# Patient Record
Sex: Male | Born: 1985 | Race: White | Marital: Single | Smoking: Current every day smoker
Health system: Northeastern US, Community
[De-identification: ages and names within clinical notes are randomized; demographics above are authoritative.]

## PROBLEM LIST (undated history)

## (undated) DIAGNOSIS — F410 Panic disorder [episodic paroxysmal anxiety] without agoraphobia: Secondary | ICD-10-CM

## (undated) DIAGNOSIS — F419 Anxiety disorder, unspecified: Secondary | ICD-10-CM

## (undated) HISTORY — PX: NO SIGNIFICANT SURGICAL HISTORY: 1000005

---

## 2004-05-20 ENCOUNTER — Emergency Department: Payer: Self-pay | Admitting: Neurology

## 2005-05-17 ENCOUNTER — Ambulatory Visit (HOSPITAL_BASED_OUTPATIENT_CLINIC_OR_DEPARTMENT_OTHER): Payer: Charity | Admitting: Clinical

## 2005-05-17 DIAGNOSIS — F1210 Cannabis abuse, uncomplicated: Secondary | ICD-10-CM

## 2005-05-17 DIAGNOSIS — F331 Major depressive disorder, recurrent, moderate: Secondary | ICD-10-CM

## 2005-05-17 LAB — URINE DRUG SCREEN 7 DRUGS
AMPHETAMINES URINE: NEGATIVE
BARBITURATES URINE: NEGATIVE
BENZODIAZEPINES URINE: POSITIVE — AB
CANNABINOIDS URINE: POSITIVE — AB
COCAINE METABOLITES URINE: NEGATIVE
ETHANOL URINE: POSITIVE — AB
OPIATES URINE: NEGATIVE

## 2005-05-20 ENCOUNTER — Ambulatory Visit (HOSPITAL_BASED_OUTPATIENT_CLINIC_OR_DEPARTMENT_OTHER): Payer: Charity

## 2005-05-22 ENCOUNTER — Ambulatory Visit (HOSPITAL_BASED_OUTPATIENT_CLINIC_OR_DEPARTMENT_OTHER): Payer: Charity

## 2005-05-24 ENCOUNTER — Ambulatory Visit (HOSPITAL_BASED_OUTPATIENT_CLINIC_OR_DEPARTMENT_OTHER): Payer: Charity

## 2005-05-27 ENCOUNTER — Ambulatory Visit (HOSPITAL_BASED_OUTPATIENT_CLINIC_OR_DEPARTMENT_OTHER): Payer: Charity

## 2005-05-29 ENCOUNTER — Ambulatory Visit (HOSPITAL_BASED_OUTPATIENT_CLINIC_OR_DEPARTMENT_OTHER): Payer: Charity

## 2005-05-31 ENCOUNTER — Ambulatory Visit (HOSPITAL_BASED_OUTPATIENT_CLINIC_OR_DEPARTMENT_OTHER): Payer: Charity

## 2005-08-20 ENCOUNTER — Encounter (HOSPITAL_BASED_OUTPATIENT_CLINIC_OR_DEPARTMENT_OTHER): Payer: Self-pay | Admitting: Clinical

## 2005-08-20 NOTE — Progress Notes (Signed)
PSYCHIATRY TERMINATION AND TRANSFER NOTE    {TERMINATION    Date treatment started: 05-13-05    Transfer/termination date: 08-11-05    Reason for treatment: ongoing sobriety    Treatment course ( response to medications, compliance): Pt. Did not engage in treatment beyond the initial intake. Thus, he is officially terminated.    Outstanding Issues: alcohol dependence and marijuana dependence.    Safe to refill:     Risk level: 1    Plan: Continue to seek treatment for ongoing substance issues.    The treatment plan will now be the responsibility ZO:[id number] substance treater.  (Clinician, please effect this transfer on the Treatment Plan link.Jeannett Senior Konya Fauble-LICSW

## 2007-09-28 ENCOUNTER — Ambulatory Visit (HOSPITAL_BASED_OUTPATIENT_CLINIC_OR_DEPARTMENT_OTHER): Payer: Self-pay | Admitting: Internal Medicine

## 2007-09-29 ENCOUNTER — Encounter (HOSPITAL_BASED_OUTPATIENT_CLINIC_OR_DEPARTMENT_OTHER): Payer: Self-pay | Admitting: Internal Medicine

## 2007-11-17 ENCOUNTER — Encounter (HOSPITAL_BASED_OUTPATIENT_CLINIC_OR_DEPARTMENT_OTHER): Payer: Self-pay | Admitting: Internal Medicine

## 2009-05-30 ENCOUNTER — Encounter (HOSPITAL_BASED_OUTPATIENT_CLINIC_OR_DEPARTMENT_OTHER): Payer: Self-pay

## 2009-05-30 ENCOUNTER — Emergency Department (HOSPITAL_BASED_OUTPATIENT_CLINIC_OR_DEPARTMENT_OTHER)
Admission: RE | Admit: 2009-05-30 | Disposition: A | Discharge status: Home / Self Care | Payer: Self-pay | Source: Emergency Department | Attending: Emergency Medicine | Admitting: Emergency Medicine

## 2009-05-30 NOTE — ED Notes (Signed)
Pt. Held onto a railing on bleachers which was unknowingly broken resulting in a 5 inch laceration to his 5th finger.

## 2009-05-31 ENCOUNTER — Emergency Department (HOSPITAL_BASED_OUTPATIENT_CLINIC_OR_DEPARTMENT_OTHER)
Admission: RE | Admit: 2009-05-31 | Disposition: A | Discharge status: Home / Self Care | Payer: Self-pay | Source: Emergency Department

## 2009-05-31 ENCOUNTER — Encounter (HOSPITAL_BASED_OUTPATIENT_CLINIC_OR_DEPARTMENT_OTHER): Payer: Self-pay

## 2009-05-31 MED ORDER — IBUPROFEN 800 MG PO TABS
ORAL_TABLET | ORAL | Status: DC
Start: 2009-05-31 — End: 2013-08-13

## 2009-05-31 NOTE — ED Notes (Signed)
WAS SEEN LAST NIGHT FOR SUTURES TO BE PLACED IN LEFT HAND.  SUTURES "CAME OUT" THIS AM.  RETURNED FOR WOUND CHECK AND RESUTURE.

## 2009-06-05 LAB — EMERGENCY ROOM NOTE

## 2009-06-07 LAB — EMERGENCY ROOM NOTE

## 2009-10-11 ENCOUNTER — Ambulatory Visit (HOSPITAL_BASED_OUTPATIENT_CLINIC_OR_DEPARTMENT_OTHER): Payer: No Typology Code available for payment source

## 2009-10-11 DIAGNOSIS — M25579 Pain in unspecified ankle and joints of unspecified foot: Principal | ICD-10-CM

## 2009-10-11 NOTE — Patient Instructions (Signed)
Rehabilitation Treatment Flowsheet    Precautions: FWB    Date: 10/11/09         Initials: JM         Visit #: 1         Time:          HEP yes         Treatment(s)          PT EVAL x         McConnell tape to pull achilles tendon laterally X with decrease in pain         bike          Stretches  gastroc,  soleus            4 way ankle stretch/PROM             4 way ankle TB            TFM R achilles tendon    tib-fib mobs            SLS on airex

## 2009-10-11 NOTE — Progress Notes (Signed)
[facility]  OUTPATIENT EVALUATION    REFERRING PROVIDER: Verna Czech, MD  [address]  [place], [place] [address]   Hx OF PRESENT ILLNESS: 23 year old male sustained ankle fx while roller skating in aug'10.  NWB with 1/2 cast and B cx x 1 month, then casted for 6 weeks.  Cast removed, still used crutches for another 2 weeks and then progressed WB.  Doing HEP: standing DF.   PRECAUTIONS: FWB   MEDICATIONS (Rx Comments, concerns): For a list of current medications review the Medication activity.    LEARNS BEST: demonstration   MENTAL STATUS/COMMUNICATION: WNL     PRIMARY LANGUAGE: English     REQUIRES INTERPRETER: No   INTERPRETER PRESENT DURING EVAL: No   DRESSING/GROOMING: WNL     DRIVING: IMPAIRED: hasn't driven for 5 years     SLEEPING: IMPAIRED: wakes often d/t pain     POSTURE/ALIGNMENT: flattened arch R foot     OTHER: decreased tib-fib mobility     NEUROLOGICAL: IMPAIRED: pt reports occasional feeling of numbness 4th and 5th R toes and lateral border R foot     PALPATION: TTP at sinus tarsi, ATFL, Achilles     GAIT: antalgic with minimal heel strike on right and decreased RLE WB.  Climbs stairs in s-t-s pattern     SKIN INTEGRITY: DRY & INTACT       Please Note: Only populated fields were assessed by provider, fields left blank were not assessed.    GIRTH LEFT RIGHT GIRTH LEFT RIGHT GIRTH LEFT RIGHT   inframalleolar 28.5cm 30cm         Figure [phone] [id number]           OTHER:    USE IMAGES ACTIVITY. IMAGE AVAILABLE: No      LEFT RIGHT  LEFT RIGHT   SHOULD A/PROM MMT A/PROM MMT HIP A/PROM MMT A/PROM MMT   FLEX.     FLEX.       EXT.     EXT.       IR     IR       ER     ER       ABD     ABD       H. ABD     ADD       H. ADD     KNEE       ELBOW     FLEX       FLEX.     EXT       EXT.     ANKLE       WRIST     DF WNL 5 -5/5 4+   FLEX.     PF  5 60/70 1   EXT.     IV  5 30/42 4   SUP/  PRON     EV  5 20 4-      SPECIAL TEST LEFT RIGHT SPECIAL TEST LEFT RIGHT SPECIAL TEST LEFT RIGHT   Talar tilt  -         Anterior drawer  +          SLS  > 30 seconds 10 sec with marked sway           Physical Therapy Plan of Care    EP:PIRJJOA Alesia Morin, MD  Referring Provider: Dr. Burnadette Peter  Diagnosis: 719.47D Ankle pain  (primary encounter diagnosis)    Assessment/Objective Findings: Patient is a 23 year old male who reports to  PT after ankle fx in August '10.  Per pt, he fractured B malleoli and the proximal fibula.  Pt currently presents with pain in his right Ankle.  Upon evaluation, pt demonstrates above noted decrease in strength, ROM, balance and mobility.  He c/o numbness in lateral foot with walking > 15 minutes.  He does have a (+) anterior drawer test with possibility of ligamentous tearing. PT will focus and strengthening and stabilizing the R ankle.          Pain, Decreased ROM, Decreased Strength, Decreased Functional Mobility, Decreased Joint Mobility and Decreased Tolerance of ADLs  Patient will benefit from skilled PT to address the rehabilitation goals outlined below.    Rehabilitation Goals  Pt. will demonstrate increased ankle ROM by 50% throughout.   Duration: 8 weeks  Pt. will demonstrate increase right ankle strength by 1/2 grade throughout.   Duration: 8 weeks  Pt. will demonstrate ability to ambulate with pain free, normalized gait pattern.   Duration: 8 weeks  Pt completes strengthening program pain free.   Duration: 8 weeks  Average ankle pain will be reduced from a 8/10 to a 4/10. Duration: 8 weeks  Able to climb stairs in step over step pattern with railing. Duration: 8 weeks  Patient to be Independent with Home Exercise Program.  Duration: 2 weeks  Long Term Goal: Pt wishes to reduce or eliminate pain.      Treatment Plan: ** Stretching/ROM Exercise  ** Therapeutic Exercise  ** Home Exercise Program  ** Joint Mobilization  ** Soft Tissue Mobilization  ** Ultrasound  ** Hot/Cold Rx  ** Gait Training  ** Functional Activities  ** Patient Education  taping    Recommend Physical Therapy be continued 2 times per week for 8 weeks.  The  rehabilitation potential for this patient is good    Patient is agreeable to treatment plan and is aware of attendance policy.    Noralee Chars, PT

## 2009-10-13 ENCOUNTER — Ambulatory Visit (HOSPITAL_BASED_OUTPATIENT_CLINIC_OR_DEPARTMENT_OTHER): Payer: No Typology Code available for payment source | Admitting: Rehabilitative and Restorative Service Providers"

## 2009-10-13 NOTE — Patient Instructions (Signed)
Rehabilitation Treatment Flowsheet    Precautions: FWB    Date: 10/11/09         Initials: JM         Visit #: 1         Time:          HEP yes         Treatment(s)          PT EVAL x         McConnell tape to pull achilles tendon laterally X with decrease in pain         bike          Stretches  gastroc,  soleus            4 way ankle stretch/PROM             4 way ankle TB            TFM R achilles tendon    tib-fib mobs            SLS on airex

## 2009-10-18 ENCOUNTER — Ambulatory Visit (HOSPITAL_BASED_OUTPATIENT_CLINIC_OR_DEPARTMENT_OTHER): Payer: No Typology Code available for payment source | Admitting: Rehabilitative and Restorative Service Providers"

## 2009-10-18 DIAGNOSIS — M25579 Pain in unspecified ankle and joints of unspecified foot: Principal | ICD-10-CM

## 2009-10-18 NOTE — Patient Instructions (Addendum)
Rehabilitation Treatment Flowsheet    Precautions: FWB  12 visits 12/03 to 12/08/09  Date: 10/11/09 10/18/09        Initials: JM JR        Visit #: 1 2        Time:  30        HEP yes yes        Treatment(s)          PT EVAL x         McConnell tape to pull achilles tendon laterally X with decrease in pain         bike          Stretches  gastroc,  soleus    [place] pulsed @ 1.3 w/cm2 achilles tendon & medial to tendon        4 way ankle stretch/PROM    4 way ankle stretch/  PROM         4 way ankle TB x10 each    X with red theraband DF, PF, INV & EV        TFM R achilles tendon    tib-fib mobs    X in prone with STM      X in supine        SLS on airex

## 2009-10-18 NOTE — Progress Notes (Signed)
S:  Patient reports he has increased pain with rotating foot in and out (INV and EV).  States he has pain over achilles when he walks on the ball of his foot (during push-off.)  Patient reports he has pain on the inside (medial to achilles)  O: Received red theraband for ankle exercises.  Refer to Rehabilitation Flow Sheet for details.  A:  Noted with resistive INV,  he has weakness with return movement. Noted ankle weakness with manual resistive exercises. Gait:  Ambulated ascending stairs without assistive device with reciprocal sequencing.   Tolerated treatment well, except as otherwise noted.   P:  Continue with skilled PT services.

## 2009-10-20 ENCOUNTER — Ambulatory Visit (HOSPITAL_BASED_OUTPATIENT_CLINIC_OR_DEPARTMENT_OTHER): Payer: No Typology Code available for payment source | Admitting: Rehabilitative and Restorative Service Providers"

## 2009-10-20 DIAGNOSIS — M25579 Pain in unspecified ankle and joints of unspecified foot: Principal | ICD-10-CM

## 2009-10-20 NOTE — Progress Notes (Signed)
S:  Patient reports he had less pain and felt stronger post last treatment.  States he is able to walk now for 30 mins, instead of only 15 mins.  States he does his exercises every day.   O: Refer to Rehabilitation Flow Sheet for details.  A:  Patient has + compliance to resistive ankle exercises as demonstrated by his performance of exercises. Progressing with treatments increased ambulatory distance with good tolerance and independent with instructed exercises.  Tolerated treatment well.  P:  Continue with skilled PT services.

## 2009-10-20 NOTE — Patient Instructions (Addendum)
Rehabilitation Treatment Flowsheet    Precautions: FWB  12 visits 12/03 to 12/08/09  Date: 10/11/09 10/18/09 10/20/09       Initials: Edmonia Caprio JR JR       Visit #: 1 2 3        Time:  30 40       HEP yes yes yes       Treatment(s)          PT EVAL x         McConnell tape to pull achilles tendon laterally X with decrease in pain  McConnelltape to pull achilles tendon laterally       bike          Stretches  gastroc,  soleus    [place] pulsed @ 1.3 w/cm2 achilles tendon & medial to tendon [place] pulsed @ 1.3 w/cm2 achilles tendon & medial to tendon       4 way ankle stretch/PROM    4 way ankle stretch/  PROM         4 way ankle TB x10 each    X with red theraband DF, PF, INV & EV X with red theraband DF, PF, INV & EV       TFM R achilles tendon    tib-fib mobs    X in prone with STM      X in supine X in prone with STM                                           SLS on airex

## 2009-10-23 ENCOUNTER — Ambulatory Visit (HOSPITAL_BASED_OUTPATIENT_CLINIC_OR_DEPARTMENT_OTHER): Payer: No Typology Code available for payment source | Admitting: Rehabilitative and Restorative Service Providers"

## 2009-10-25 ENCOUNTER — Ambulatory Visit (HOSPITAL_BASED_OUTPATIENT_CLINIC_OR_DEPARTMENT_OTHER): Payer: No Typology Code available for payment source

## 2009-10-25 DIAGNOSIS — M25579 Pain in unspecified ankle and joints of unspecified foot: Principal | ICD-10-CM

## 2009-10-25 NOTE — Progress Notes (Addendum)
.    S: "The ankle feels a lot stronger.  I don't have any pain right now."  O: Refer to Rehabilitation Treatment Flowsheet  A: Pt able to perform all exercise with good tolerance.  Still presents with decreased balance/proprioception.  AROM much improved.  DF to 3 degrees (was -5 AROM)  P: Continue physical therapy per POC.

## 2009-10-25 NOTE — Patient Instructions (Addendum)
Rehabilitation Treatment Flowsheet    Precautions: FWB  12 visits 12/03 to 12/08/09  Date: 10/11/09 10/18/09 10/20/09 10/25/09      Initials: Edmonia Caprio JR JR JM      Visit #: 1 2 3 4       Time:  30 40       HEP yes yes yes       Treatment(s)          PT EVAL x         McConnell tape to pull achilles tendon laterally X with decrease in pain  McConnelltape to pull achilles tendon laterally       bike    X 5 min 1.5 Kp      Stretches  gastroc,  soleus    [place] pulsed @ 1.3 w/cm2 achilles tendon & medial to tendon [place] pulsed @ 1.3 w/cm2 achilles tendon & medial to tendon Stretch gastroc and soleus 30 sec x 3 each      4 way ankle stretch/PROM    4 way ankle stretch/  PROM  Rocker board SLS A/P and M/L 20x       4 way ankle TB x10 each    X with red theraband DF, PF, INV & EV X with red theraband DF, PF, INV & EV X GTB 10x      TFM R achilles tendon    tib-fib mobs  Subtalar rocks    X in prone with STM      X in supine X in prone with STM       Bosu squat forward and lateral 20x      X    x      BAPS    L2 A/P, M/L, CW, CCW x 20 each      SLS on airex    x      Tandem with TBand cross pull    10x each direction        Ice      X 10 min ankle

## 2009-10-30 ENCOUNTER — Ambulatory Visit (HOSPITAL_BASED_OUTPATIENT_CLINIC_OR_DEPARTMENT_OTHER): Payer: No Typology Code available for payment source

## 2009-10-30 DIAGNOSIS — M25579 Pain in unspecified ankle and joints of unspecified foot: Principal | ICD-10-CM

## 2009-10-30 NOTE — Progress Notes (Addendum)
.    S: Reports soreness after last rx until next am, then ok!  O: Refer to Rehabilitation Treatment Flowsheet  A: AROM is near WNL.  Pt wishes to get back to running and basketball.  Will begin to upgrade exercise as appropriate.  P: Continue physical therapy per POC.  Trial treadmill and light jogging next rx

## 2009-10-30 NOTE — Patient Instructions (Addendum)
Rehabilitation Treatment Flowsheet    Precautions: FWB  12 visits 12/03 to 12/08/09  Date: 10/11/09 10/18/09 10/20/09 10/25/09 10/30/09     Initials: JM JR JR JM JM     Visit #: 1 2 3 4 5      Time:  30 40       HEP yes yes yes       Treatment(s)          PT EVAL x         McConnell tape to pull achilles tendon laterally X with decrease in pain  McConnelltape to pull achilles tendon laterally       bike    X 5 min 1.5 Kp X 5 min     Stretches  gastroc,  soleus    [place] pulsed @ 1.3 w/cm2 achilles tendon & medial to tendon [place] pulsed @ 1.3 w/cm2 achilles tendon & medial to tendon Stretch gastroc and soleus 30 sec x 3 each gastroc and soleus 30 sec x 2 each     4 way ankle stretch/PROM    4 way ankle stretch/  PROM  Rocker board SLS A/P and M/L 20x Mini jumps on trampoline B and U LE      4 way ankle TB x10 each    X with red theraband DF, PF, INV & EV X with red theraband DF, PF, INV & EV X GTB 10x With 1 1/2 # weight 10x2     TFM R achilles tendon    tib-fib mobs  Subtalar rocks    X in prone with STM      X in supine X in prone with STM       Bosu squat forward and lateral 20x      X    x Bosu squat forward and lateral 20x      X  X       BAPS    L2 A/P, M/L, CW, CCW x 20 each L 3 20x each     SLS on airex    x x     Tandem with TBand cross pull    10x each direction With RTB 20 x each       Ice      X 10 min ankle x                Star pattern step downs 2" and 4" step x 10

## 2009-11-01 ENCOUNTER — Ambulatory Visit (HOSPITAL_BASED_OUTPATIENT_CLINIC_OR_DEPARTMENT_OTHER): Payer: No Typology Code available for payment source | Admitting: Rehabilitative and Restorative Service Providers"

## 2009-11-06 ENCOUNTER — Ambulatory Visit (HOSPITAL_BASED_OUTPATIENT_CLINIC_OR_DEPARTMENT_OTHER): Payer: No Typology Code available for payment source | Admitting: Rehabilitative and Restorative Service Providers"

## 2009-11-08 ENCOUNTER — Ambulatory Visit (HOSPITAL_BASED_OUTPATIENT_CLINIC_OR_DEPARTMENT_OTHER): Payer: No Typology Code available for payment source | Admitting: Rehabilitative and Restorative Service Providers"

## 2009-11-13 ENCOUNTER — Ambulatory Visit (HOSPITAL_BASED_OUTPATIENT_CLINIC_OR_DEPARTMENT_OTHER): Payer: No Typology Code available for payment source

## 2010-12-28 NOTE — Progress Notes (Signed)
This encounter was opened in error.  Please disregard.

## 2011-01-14 ENCOUNTER — Emergency Department (HOSPITAL_BASED_OUTPATIENT_CLINIC_OR_DEPARTMENT_OTHER)
Admission: RE | Admit: 2011-01-14 | Disposition: A | Discharge status: Home / Self Care | Payer: Self-pay | Source: Emergency Department | Attending: Emergency Medicine | Admitting: Emergency Medicine

## 2011-01-14 ENCOUNTER — Encounter (HOSPITAL_BASED_OUTPATIENT_CLINIC_OR_DEPARTMENT_OTHER): Payer: Self-pay

## 2011-01-14 LAB — HOLD RED TOP TUBE

## 2011-01-14 LAB — COMPREHENSIVE METABOLIC PANEL
ALANINE AMINOTRANSFERASE: 29 IU/L (ref 10–40)
ALBUMIN: 4.8 g/dl (ref 3.4–4.8)
ALKALINE PHOSPHATASE: 83 IU/L (ref 25–106)
ANION GAP: 14 mmol/L (ref 2–25)
ASPARTATE AMINOTRANSFERASE: 38 IU/L — ABNORMAL HIGH (ref 8–34)
BILIRUBIN TOTAL: 0.8 mg/dl (ref 0.2–1.1)
BUN (UREA NITROGEN): 9 mg/dl (ref 6–20)
CALCIUM: 10.2 mg/dl (ref 8.6–10.3)
CARBON DIOXIDE: 24 mmol/L (ref 22–32)
CHLORIDE: 102 mmol/L (ref 101–111)
CREATININE: 1.1 mg/dl (ref 0.7–1.2)
ESTIMATED GLOMERULAR FILT RATE: >60 mL/min (ref >60)
Glucose Random: 114 mg/dl (ref 74–160)
POTASSIUM: 3.9 mmol/L (ref 3.5–5.1)
SODIUM: 140 mmol/L (ref 135–144)
TOTAL PROTEIN: 7.6 g/dl — ABNORMAL HIGH (ref 5.9–7.5)

## 2011-01-14 LAB — CBC, PLATELET & DIFFERENTIAL
BASOPHIL %: 0.1 % (ref 0.0–2.0)
EOSINOPHIL %: 2.2 % (ref 0.0–7.0)
HEMATOCRIT: 47.7 % (ref 42.0–54.0)
HEMOGLOBIN: 16.5 g/dl (ref 14.0–18.0)
LYMPHOCYTE %: 13.9 % (ref 13.0–39.0)
MEAN CORP HGB CONC: 34.7 g/dl (ref 32.0–36.0)
MEAN CORPUSCULAR HGB: 32.4 pg (ref 27.0–33.0)
MEAN CORPUSCULAR VOL: 93.5 fl (ref 80.0–100.0)
MEAN PLATELET VOLUME: 8.3 fl (ref 6.4–10.8)
MONOCYTE %: 8.3 % (ref 1.0–12.0)
NEUTROPHIL %: 75.5 % (ref 46.0–79.0)
PLATELET COUNT: 290 10*3/uL (ref 150–400)
RBC DISTRIBUTION WIDTH: 13.3 % (ref 11.5–14.3)
RED BLOOD CELL COUNT: 5.1 M/uL (ref 4.50–6.10)
WHITE BLOOD CELL COUNT: 11.2 10*3/uL — ABNORMAL HIGH (ref 4.0–10.8)

## 2011-01-14 LAB — XR CHEST 2 VIEWS

## 2011-01-14 LAB — LIPASE: LIPASE: 20 U/L (ref 10–50)

## 2011-01-14 MED ORDER — LORAZEPAM 1 MG PO TABS
1.0000 mg | ORAL_TABLET | Freq: Three times a day (TID) | ORAL | Status: AC | PRN
Start: 2011-01-14 — End: 2011-02-13

## 2011-01-14 MED ORDER — SODIUM CHLORIDE 0.9 % IV BOLUS
1000.00 mL | Freq: Once | INTRAVENOUS | Status: AC
Start: 2011-01-14 — End: 2011-01-14
  Administered 2011-01-14: 1000 mL via INTRAVENOUS

## 2011-01-14 MED ORDER — LORAZEPAM 1 MG PO TABS
1.00 mg | ORAL_TABLET | Freq: Once | ORAL | Status: AC
Start: 2011-01-14 — End: 2011-01-14
  Administered 2011-01-14: 1 mg via ORAL
  Filled 2011-01-14: qty 1

## 2011-01-14 NOTE — Discharge Instructions (Signed)
You were seen in the ER for difficulty breathing and hyperventilation.  Lab tests and an xray were normal, as was an ekg. It is most likely  You had a panic attack.      Rest.   Drink plenty of fluids and eat well.  Take ativan as needed for anxiety. This is only a short term solution.  Do not drink alcohol or drive while taking this medicine.      Follow up with your primary care doctor.  If you need a new doctor, please call the [facility] for an appointment,.      Please see Peter Roberson's office (financial counselor) on your way out to get help with the insurance issues.        Return to the ER for worsening symptoms, fever ,vomiting, fainting, chest pain, shortness of breath, or any other concerning symptoms

## 2011-01-15 LAB — EKG

## 2011-01-17 LAB — EMERGENCY ROOM NOTE

## 2011-04-12 ENCOUNTER — Emergency Department (HOSPITAL_BASED_OUTPATIENT_CLINIC_OR_DEPARTMENT_OTHER)
Admission: RE | Admit: 2011-04-12 | Disposition: A | Discharge status: Home / Self Care | Payer: Self-pay | Source: Emergency Department | Attending: Physician Assistant | Admitting: Physician Assistant

## 2011-04-12 ENCOUNTER — Encounter (HOSPITAL_BASED_OUTPATIENT_CLINIC_OR_DEPARTMENT_OTHER): Payer: Self-pay

## 2011-04-12 HISTORY — DX: Panic disorder (episodic paroxysmal anxiety): F41.0

## 2011-04-12 HISTORY — DX: Anxiety disorder, unspecified: F41.9

## 2011-04-12 LAB — RAPID STREP (POINT OF CARE): STREP SCREEN: POSITIVE

## 2011-04-12 MED ORDER — DIPHENHYDRAMINE HCL 25 MG PO CAPS
25.00 mg | ORAL_CAPSULE | Freq: Once | ORAL | Status: AC
Start: 2011-04-12 — End: 2011-04-12
  Administered 2011-04-12: 25 mg via ORAL
  Filled 2011-04-12: qty 1

## 2011-04-12 MED ORDER — PREDNISONE 20 MG PO TABS
60.00 mg | ORAL_TABLET | Freq: Once | ORAL | Status: AC
Start: 2011-04-12 — End: 2011-04-12
  Administered 2011-04-12: 60 mg via ORAL
  Filled 2011-04-12: qty 3

## 2011-04-12 MED ORDER — PREDNISONE 20 MG PO TABS
60.00 mg | ORAL_TABLET | Freq: Every day | ORAL | Status: AC
Start: 2011-04-13 — End: 2011-04-15

## 2011-04-12 MED ORDER — PENICILLIN V POTASSIUM 500 MG PO TABS
500.00 mg | ORAL_TABLET | Freq: Once | ORAL | Status: AC
Start: 2011-04-12 — End: 2011-04-12
  Administered 2011-04-12: 500 mg via ORAL
  Filled 2011-04-12: qty 1

## 2011-04-12 MED ORDER — PENICILLIN V POTASSIUM 500 MG PO TABS
500.00 mg | ORAL_TABLET | Freq: Three times a day (TID) | ORAL | Status: AC
Start: 2011-04-12 — End: 2011-04-22

## 2011-04-12 NOTE — ED Notes (Signed)
Seen by PA. Medicated per order.

## 2011-04-12 NOTE — ED Notes (Signed)
Patient Disposition    Patient education for diagnosis, medications, activity, diet and follow-up.  Patient left ED 2:13 PM.  Patient rep received written instructions.    Discharged to: home

## 2011-04-12 NOTE — ED Triage Note (Signed)
C/o sore throat pain with swallowing.  "My uvula is bleeding."  Throat red with exudate.

## 2011-04-12 NOTE — ED Provider Notes (Signed)
This patient was seen primarily by me. I have reviewed the nursing notes and medication list.    HPI:  This is a 25 year old year old male presenting to the ER with a chief complaint of concern about uvular swelling. States this morning was clearing his throat, felt uvula touching his tongue and then had lots of blood in his mouth. Concerned it may have ripped. Denies sore throat, fever or sick contacts. Pt is allergic to tree nuts but has had none this morning, usual reaction to that is hives. No rash today, no tingling in lips or difficulty swallowing or breathing. Did not take any new medications this morning. Patient is a smoker, takes only Paxil daily.       ROS: Pertinent positives were reviewed as per the HPI above. All other systems were reviewed and are negative.    Past Medical History:    Past Medical History    NO KNOWN PROBLEMS     Anxiety     Panic attack            Past Surgical History:    Past Surgical History    NO SIGNIFICANT SURGICAL HISTORY          Medications:     No current facility-administered medications on file prior to encounter.  Current outpatient prescriptions ordered prior to encounter:  paroxetine (PAXIL) 10 MG tablet Take 10 mg by mouth every morning. Disp:  Rfl:    IBUPROFEN 800 MG OR TABS Take 1 tab by mouth every 8 hours as needed for pain Disp: 20 Rfl: 0         Social History:    Social History   Marital Status: Single  Spouse Name: N/A    Years of Education: N/A  Number of Children: N/A     Occupational History  None on file     Social History Main Topics   Smoking status: Current Everyday Smoker  0.5 Packs/Day  For 10.00 Years     Types: Cigarettes    Smokeless tobacco:     Alcohol Use: Yes    Comment: occas.    Drug Use: Yes     Special: Marijuana    Sexually Active: Yes  Partner(s): Male    Birth Control/ Protection: Condom     Other Topics Concern   None on file     Social History Narrative   None on file       Allergies:  Review of Patient's Allergies indicates:  No  Known Allergies    Physical Exam:  BP 129/92  Pulse 88  Temp 98.5 F  Resp 16  Ht 1.829 m (6')  Wt 90.719 kg (200 lb)  BMI 27.12 kg/m2  SpO2 100%    General:  Alert x oriented x 4, no acute distress    HEENT:  NCAT, PERRL, EOMI, pharynx is erythematous, tonsils bilaterally enlarged but no exudate. Uvula is noted to be swollen, small ulcerative type sore on left side.  Tympanic membranes are normal appearing bilaterally. Neck is supple.    Lymph: No palpable lymphadenopathy    Heart:  RRR, no murmurs, rubs or gallops    Lungs:  Clear to auscultation in all fields    Musculoskeletal:  No obvious deformity, no ROM defecit, distal CSM intact, 5/5 strength throughout    Skin:  Warm, dry, no rash    Psych:  Normal affect      ED Course and Medical Decision-making:    Rapid strep +  Unlikely allergic reaction due to isolated uvular edema with rapid strep  Benadryl 25 mg / Prednisone 60 mg / Penicillin VK 500 mg in  ED        Reasons to return to the ED were reviewed in detail. The patient agrees with this plan and disposition.Discharged in stable condition.      Diagnoses:  Strep throat      Plan:  Benadryl 25 mg q 6h  Prednisone 60 mg PO x 2 more days  Penicillin VK 500 mg x 10 days  F/u PCP  Return to ED PRN    Marlene Lard, PA-C

## 2011-04-12 NOTE — Discharge Instructions (Signed)
You have strep throat. Drink plenty of fluids and take your antibiotic until it is gone. Alternate Tylenol with Motrin per box dosing instructions for fever and pain control. You can use over the counter throat lozenges to help as well. Follow up with your PCP in 2 days for follow up and return to ED with difficulty swallowing or breathing, worsening pain or uncontrolled fever.  Take Prednisone as prescribed for 2 more days starting tomorrow. Benadryl 25 mg every 6 hours will also help swelling.

## 2012-01-02 ENCOUNTER — Encounter (HOSPITAL_BASED_OUTPATIENT_CLINIC_OR_DEPARTMENT_OTHER): Payer: Self-pay | Admitting: Registered Nurse

## 2012-01-02 ENCOUNTER — Emergency Department (HOSPITAL_BASED_OUTPATIENT_CLINIC_OR_DEPARTMENT_OTHER)
Admission: RE | Admit: 2012-01-02 | Disposition: A | Discharge status: Home / Self Care | Payer: Self-pay | Source: Emergency Department | Attending: Physician Assistant | Admitting: Physician Assistant

## 2012-01-02 LAB — XR HAND RIGHT MINIMUM 3 VIEWS

## 2012-01-02 NOTE — ED Notes (Signed)
Pt to xray as ordered

## 2012-01-02 NOTE — ED Notes (Signed)
Patient Disposition  Splint and sling on as ordered.  Patient education for diagnosis, medications, activity, diet and follow-up.  Patient left ED 12:55 PM.  Patient received written instructions.  Pt ambulatory out of ED.     Discharged to: home

## 2012-01-02 NOTE — ED Triage Note (Signed)
Pt states he punched a wall yesterday injuring rt hand  Pt was seen at [facility] last night and had xray's  Ace wrap applied and motrin given  Pt called today and told that his x-rays were positive for fracture  Pt states he was told to go to ED for further treatment  Pt rt hand is swollen in area of 5 th metacarpal  Decrease ROM to digits  Alert, oriented and ambulatory into ED

## 2012-01-02 NOTE — ED Provider Notes (Signed)
eMERGENCY dEPARTMENT eNCOUnter    CHIEF COMPLAINT    Patient presents with:    Hand Pain - HAND PAIN      HPI    Peter Roberson is a 26 year old male who presents with right hand injury.  Patient states that he punched a wall yesterday injuring his right hand.  He states that he has started been to [facility], and x-ray was taken that he states showed a fracture.  01/02/2012 11:15 AM.    PAST MEDICAL HISTORY      Past Medical History    NO KNOWN PROBLEMS     Anxiety     Panic attack          SURGICAL HISTORY      Past Surgical History    NO SIGNIFICANT SURGICAL HISTORY          CURRENT MEDICATIONS    1. PAROXETINE HCL 10 MG PO TABS  Route: Oral ZOX:WRUE 10 mg by mouth every morning.  Dispense:  Refill:   2. IBUPROFEN 800 MG PO TABS  Route: Oral AVW:UJWJ 1 tab by mouth every 8 hours as needed for pain  Dispense: 20 Refill: 0    ALLERGIES    Review of Patient's Allergies indicates:  No Known Allergies    FAMILY HISTORY    History reviewed. No pertinent family history.    SOCIAL HISTORY    Social History    Marital Status: Single              Spouse Name:                       Years of Education:                 Number of children:               Social History Main Topics    Smoking Status: Current Everyday Smoker         Packs/Day: .5    Years: 10        Types: Cigarettes    Alcohol Use: Yes                Comment: occas.    Drug Use: Yes                Special: Marijuana    Sexual Activity: Yes               Partners with: Male       Birth Control/Protection: Condom        REVIEW OF SYSTEMS    Constitutional:  Denies fever, chills, weight loss or weakness.  Respiratory:  Denies cough or shortness of breath.  Cardiovascular:  Denies chest pain, palpitations or swelling.  Musculoskeletal:  Denies back pain.  Psychiatric:  Denies depression, suicidal ideation or homicidal ideation.  All systems negative except as marked.     PHYSICAL EXAM    Vital Signs: BP 156/77  Pulse 104  Temp 98.6 F  Resp 20  SpO2  99%   Constitutional:  Well-developed, Well-nourished, No acute distress, Non-toxic appearance.   Respiratory:  Normal breath sounds, No respiratory distress, No wheezing, No chest tenderness.   Cardiovascular:  Normal heart rate, Normal rhythm, No murmurs, No rubs, No gallops.   Musculoskeletal:  Intact distal pulses, minor edema and tenderness through the right fifth metacarpal.  Full active range of motion of all fingers on right hand.  Skin:  Warm,  Dry, skin shows no abrasions or contusions to the right hand.    Psychiatric:  Affect normal, Judgment normal, Mood normal.       RADIOLOGY  Plain film right hand    ED COURSE & MEDICAL DECISION MAKING    Patient states that he broke his hand yesterday- an x-ray was done to show an old fracture of the proximal fifth phalange.  No other fractures found by radiology.  Patient was given an arm sling and a wrist splint; he states he has primary care physician to followup with.  FINAL IMPRESSION  Hand injury  CONSULTS  None    FOLLOW-UP  Follow-up Information    None            Electronically signed by: Nicole Kindred. Shalynn Jorstad, PA-C, 01/02/2012 12:21 PM

## 2012-01-02 NOTE — ED Notes (Signed)
Pt returned to ED, awaiting results

## 2012-01-02 NOTE — Discharge Instructions (Signed)
Take Tylenol or Motrin 2 to 3 tablets every 4 to 6 hours as needed for pain. Keep elevated and apply ice pack to affected area to control swelling. Read discharge instructions given and follow up with your doctor as needed.    Hand Injuries  You have an injured hand.  Minor fractures, sprains, bruises and burns of the hand are often managed in much the same way. Treatment includes:  · Keep your hand elevated above the level of your heart for the next few days until the pain and swelling improve.   · Hand dressings and splints are used to reduce motion, relieve pain and prevent re-injury. Do not remove your dressing and splint until your doctor approves.   · Apply ice packs for 20-30 minutes every few hours for 2-3 days to reduce pain and swelling due to fractures, sprains, and deep bruises.   · Medicine to reduce pain and inflammation is often helpful.   Early motion exercises are sometimes needed to reduce joint stiffness after a hand injury; however, do not use your hand for any activities that increase pain.   Please see your doctor for follow-up care as advised.  Document Released: 12/05/2004 Document Re-Released: 01/22/2010  [employer]® Patient Information ©2012 [employer].

## 2013-08-13 ENCOUNTER — Emergency Department (HOSPITAL_BASED_OUTPATIENT_CLINIC_OR_DEPARTMENT_OTHER)
Admission: RE | Admit: 2013-08-13 | Disposition: A | Discharge status: Home / Self Care | Payer: Self-pay | Source: Emergency Department | Attending: Physician Assistant | Admitting: Physician Assistant

## 2013-08-13 ENCOUNTER — Encounter (HOSPITAL_BASED_OUTPATIENT_CLINIC_OR_DEPARTMENT_OTHER): Payer: Self-pay

## 2013-08-13 LAB — CBC, PLATELET & DIFFERENTIAL
ABSOLUTE BASO COUNT: 0 10*3/uL (ref 0.0–0.1)
ABSOLUTE EOSINOPHIL COUNT: 0.1 10*3/uL (ref 0.0–0.8)
ABSOLUTE IMM GRAN COUNT: 0.09 10*3/uL — ABNORMAL HIGH (ref 0.00–0.03)
ABSOLUTE LYMPH COUNT: 1.7 10*3/uL (ref 0.6–5.9)
ABSOLUTE MONO COUNT: 1.5 10*3/uL — ABNORMAL HIGH (ref 0.2–1.4)
ABSOLUTE NEUTROPHIL COUNT: 8.5 10*3/uL — ABNORMAL HIGH (ref 1.6–8.3)
BASOPHIL %: 0.3 % (ref 0.0–1.2)
EOSINOPHIL %: 1.2 % (ref 0.0–7.0)
HEMATOCRIT: 48.7 % (ref 40.1–51.0)
HEMOGLOBIN: 16.5 g/dL (ref 13.7–17.5)
IMMATURE GRANULOCYTE %: 0.8 % — ABNORMAL HIGH (ref 0.0–0.4)
LYMPHOCYTE %: 14 % — ABNORMAL LOW (ref 15.0–54.0)
MEAN CORP HGB CONC: 33.9 g/dL (ref 31.0–37.0)
MEAN CORPUSCULAR HGB: 31.3 pg (ref 26.0–34.0)
MEAN CORPUSCULAR VOL: 92.4 fL (ref 80.0–100.0)
MEAN PLATELET VOLUME: 9.7 fL (ref 8.7–12.5)
MONOCYTE %: 12.4 % (ref 4.0–13.0)
NEUTROPHIL %: 71.3 % (ref 40.0–75.0)
PLATELET COUNT: 285 10*3/uL (ref 150–400)
RBC DISTRIBUTION WIDTH STD DEV: 42 fL (ref 35.1–46.3)
RBC DISTRIBUTION WIDTH: 12.6 % (ref 11.5–14.3)
RED BLOOD CELL COUNT: 5.27 M/uL (ref 4.60–6.10)
WHITE BLOOD CELL COUNT: 12 10*3/uL — ABNORMAL HIGH (ref 4.0–11.0)

## 2013-08-13 LAB — COMPREHENSIVE METABOLIC PANEL
ALANINE AMINOTRANSFERASE: 60 U/L — ABNORMAL HIGH (ref 12–45)
ALBUMIN: 4.3 g/dL (ref 3.4–5.0)
ALKALINE PHOSPHATASE: 88 U/L (ref 45–117)
ANION GAP: 15 mmol/L (ref 5–15)
ASPARTATE AMINOTRANSFERASE: 44 U/L — ABNORMAL HIGH (ref 8–34)
BILIRUBIN TOTAL: 0.7 mg/dL (ref 0.2–1.0)
BUN (UREA NITROGEN): 11 mg/dL (ref 7–18)
CALCIUM: 10.2 mg/dL — ABNORMAL HIGH (ref 8.5–10.1)
CARBON DIOXIDE: 25 mmol/L (ref 21–32)
CHLORIDE: 100 mmol/L (ref 98–107)
CREATININE: 1 mg/dL (ref 0.7–1.2)
ESTIMATED GLOMERULAR FILT RATE: >60 mL/min (ref >60)
Glucose Random: 114 mg/dL (ref 74–160)
POTASSIUM: 4.4 mmol/L (ref 3.5–5.1)
SODIUM: 140 mmol/L (ref 136–145)
TOTAL PROTEIN: 7.8 g/dL (ref 6.4–8.2)

## 2013-08-13 LAB — LIPASE: LIPASE: 58 U/L — ABNORMAL LOW (ref 73–393)

## 2013-08-13 MED ORDER — SODIUM CHLORIDE 0.9 % IV BOLUS
1000.00 mL | Freq: Once | INTRAVENOUS | Status: AC
Start: 2013-08-13 — End: 2013-08-13
  Administered 2013-08-13: 1000 mL via INTRAVENOUS

## 2013-08-13 MED ORDER — METOCLOPRAMIDE HCL 5 MG/ML IJ SOLN
10.00 mg | Freq: Once | INTRAMUSCULAR | Status: AC
Start: 2013-08-13 — End: 2013-08-13
  Administered 2013-08-13: 10 mg via INTRAVENOUS
  Filled 2013-08-13: qty 2

## 2013-08-13 MED ORDER — METOCLOPRAMIDE HCL 5 MG PO TABS
5.00 mg | ORAL_TABLET | Freq: Three times a day (TID) | ORAL | Status: AC | PRN
Start: 2013-08-13 — End: 2013-08-16

## 2013-08-13 NOTE — Discharge Instructions (Signed)
DIAGNOSIS & TREATMENT:  You were seen in a [facility] Emergency Department for abdominal pain. You were treated with fluids and anti-nausea medication reglan.    TEST RESULTS:   Blood work was reassuring.    FURTHER CARE:  Hydration:  Please drink water or full calorie/sugar Gatorade as being sick can cause you to become very dehydrated quickly making you feel worse including severe headaches. We recommend Pedialyte. You should target to have your urine light yellow to clear. Use low calorie sports drinks cautiously as some have artificial sweeteners that can cause diarrhea if you drink too much of them. Avoid drinks with caffeine like soda, coffee, and tea as caffeine will dehydrate you.    NEW MEDICATIONS:  Reglan (metoclopramide): anti-nausea medication.     WHEN SHOULD YOU BE SEEN NEXT?   Please call your doctor and be seen with in the next 3 days for re-evaluation if your symptoms are not improving. If you do not have a primary care doctor or would like to transfer your primary care to [facility], please call [phone] to set one up an appointment.    WHEN SHOULD YOU RETURN TO THE ED?  Please return to the emergency room if you develop fever, Vomiting that prevents you from tolerating fluids, increased abdominal pain, your symptoms worsen, you get new symptoms or you are unable to schedule follow up care.       Abdominal Pain  Abdominal pain can be caused by many things. Your caregiver decides the seriousness of your pain by an examination and possibly blood tests and X-rays. Many cases can be observed and treated at home. Most abdominal pain is not caused by a disease and will probably improve without treatment. However, in many cases, more time must pass before a clear cause of the pain can be found. Before that point, it may not be known if you need more testing, or if hospitalization or surgery is needed.  HOME CARE INSTRUCTIONS    Do not take laxatives unless directed by  your caregiver.   Take pain medicine only as directed by your caregiver.   Only take over-the-counter or prescription medicines for pain, discomfort, or fever as directed by your caregiver.   Try a clear liquid diet (broth, tea, or water) for as long as directed by your caregiver. Slowly move to a bland diet as tolerated.  SEEK IMMEDIATE MEDICAL CARE IF:    The pain does not go away.   You have a fever.   You keep throwing up (vomiting).   The pain is felt only in portions of the abdomen. Pain in the right side could possibly be appendicitis. In an adult, pain in the left lower portion of the abdomen could be colitis or diverticulitis.   You pass bloody or black tarry stools.  MAKE SURE YOU:    Understand these instructions.   Will watch your condition.   Will get help right away if you are not doing well or get worse.  Document Released: 08/07/2005 Document Revised: 01/20/2012 Document Reviewed: 06/15/2008  [facility] Patient Information 2014 [place], [place].

## 2015-08-28 ENCOUNTER — Encounter (HOSPITAL_BASED_OUTPATIENT_CLINIC_OR_DEPARTMENT_OTHER): Payer: Self-pay

## 2015-08-28 ENCOUNTER — Emergency Department (HOSPITAL_BASED_OUTPATIENT_CLINIC_OR_DEPARTMENT_OTHER)
Admission: RE | Admit: 2015-08-28 | Disposition: A | Discharge status: Home / Self Care | Payer: Self-pay | Source: Emergency Department | Attending: Physician Assistant | Admitting: Physician Assistant

## 2015-08-28 MED ORDER — LIDOCAINE HCL 1 % IJ SOLN
20.0000 mL | Freq: Once | INTRAMUSCULAR | Status: DC
Start: 2015-08-28 — End: 2015-08-28
  Filled 2015-08-28: qty 20

## 2015-08-28 MED ORDER — OCTYL CYANOACRYLATE TISSUE ADHESIVE
1.0000 | Freq: Once | TOPICAL | Status: DC
Start: 2015-08-28 — End: 2015-08-28
  Filled 2015-08-28: qty 1

## 2015-08-28 NOTE — Narrator Note (Signed)
Patient Disposition    Patient education for diagnosis, medications, activity, diet and follow-up.  Patient left ED 6:05 PM.  Patient rep received written instructions.  Interpreter to provide instructions: No    Patient belongings with patient: YES    Have all existing LDAs been addressed? N/A    Have all IV infusions been stopped? N/A    Discharged to: Discharged to home

## 2015-08-28 NOTE — ED Provider Notes (Signed)
ED nursing record was reviewed. Prior records as available electronically through the Epic record were reviewed.    HPI:    This is a 29 year old male patient presents to the Emergency Department with chief complaint of R index laceration 2 hours PTA. Pt was working at [employer], cut his finger on the deli slicer. Rinsed it, put peroxide. Still bleeding, came here. No numbness/tingling. No motor deficits.         ROS: Pertinent positives and negatives were reviewed as per the HPI above. All other systems were noncontributory.      Past Medical History/Problem list:    Past Medical History    Anxiety     NO KNOWN PROBLEMS     Panic attack      There is no problem list on file for this patient.        Past Surgical History:   Past Surgical History    NO SIGNIFICANT SURGICAL HISTORY           Allergies:  Review of Patient's Allergies indicates:  No Known Allergies    Medications:   No current facility-administered medications on file prior to encounter.   Current Outpatient Prescriptions on File Prior to Encounter:  citalopram (CELEXA) 20 MG tablet Take 20 mg by mouth daily. Disp:  Rfl:        Social History:   Social History   Marital status: Single  Spouse name: N/A    Years of education: N/A  Number of children: N/A     Occupational History  None on file     Social History Main Topics   Smoking status: Current Every Day Smoker  0.50 Packs/day  For 10.00 Years     Types: Cigarettes    Smokeless tobacco: Not on file    Alcohol use Yes  7.0 oz/week    14 Cans of beer per week         Comment: occas.    Drug use: Yes     Special: Marijuana    Sexual activity: Yes    Partners: Female    Birth control/ protection: Condom     Other Topics Concern   None on file     Social History Narrative       Family History: noncontributory      Physical Exam:  BP 125/89  Pulse 100  Temp 98.6 F  Resp 18  Wt 95.3 kg (210 lb)  SpO2 99%  BMI 28.48 kg/m2    GENERAL: Well appearing, No acute distress, non-toxic.   SKIN:  1.5 inch laceration  to tip of R index finger, nail plate intact   EXTREMITIES:  No obvious deformities.  R index finger with 5/5 strength, and FROM in all directions to PIP, DIP. Brisk cap refill. Radial pulse intact. Distal sensation intact.  NEUROLOGIC:  Alert and oriented x3; speaking in clear fluent sentences. CNsII-XII symmetrical and intact.       RESULTS  No results found for this visit on 08/28/15 (from the past 24 hour(s)).     MEDICATIONS ADMINISTERED ON THIS VISIT  No orders of the defined types were placed in this encounter.       ED Course and Medical Decision-making:  This is a 29 year old male patient presents to the Emergency Department with chief complaint of R index laceration 2 hours PTA. No evidence for acute bony fracture, dislocation. No need for any hand imaging. R index finger with no evidence of obvious tendon  disruption. No gross tendon visualized.     Procedure Note (Laceration Repair): Laceration to R index finger approximately 1.5 inch total length. I irrigated this area copiously with 30 ccs of normal saline. Area cleaned with alcohol and I anesthetized with lidocaine 1%. I repaired this laceration with 8 simple interrupted sutures using 5.0 nylon. EBL less than 2 ml. Sterile technique was used. Patient tolerated procedure well. Care instructions given. Return in 7 days for removal. Tetanus was up to date.    Was originally going to use glue, but after cleansing, finger started bleeding profusely.   Achieved better hemostasis with sutures.     Pt to f/u with [facility] in 2-3 days. Referral sent thru [facility]     Diagnosis: R index finger laceration     Condition: stable  Disposition: home       Grant Fontana PA-C

## 2015-08-28 NOTE — ED Triage Note (Signed)
Patient cut right index finger on slicer at work. Arrived with bleeding controlled with pressure dressing; can bend finger slightly

## 2015-08-28 NOTE — Discharge Instructions (Signed)
DIAGNOSIS & TREATMENT:  You were seen in a [facility] Emergency Department for a right finger laceration. This was closed with 8 sutures. These need to be taken out.    FURTHER CARE:    Lacertion Care: Your laceration was repaired with sutures (stitches) - You will have a scar. Please keep the wound dry for 24 hours. Then you can briefly wash the laceration if needed. Do not soak the wound as this will make the edges weak and they may rip resulting in an irregular or worse scar. Keep Bandage on for first few days then as needed for protection or comfort.    Scar Prevention: Please keep the laceration out of direct sunlight. You should apply sun screen (SPF 30 or higher) to the wound for 6 months. You may apply Vitamin E lotions as this may reduce the chance of scarring.      WHEN SHOULD YOU BE SEEN NEXT?  Please call your doctor and be seen with the next 7-10 days for re-evaluation and to have the sutures removed.     You can also return to this, or any, [facility] Emergency Department ( [address], [place], or [facility]). We are open 24 hours a day.    If you do not have a primary care doctor or would like to transfer your primary care to [facility], please call [phone] to set one up an appointment.     WHEN SHOULD YOU RETURN TO THE ED?  Please return to the emergency room if you develop fever, white pus draining from the wound, you wound becomes angry red or you have red streaking away from the wound, your symptoms worsen, you get new symptoms or you are unable to schedule follow up care.     Laceration Care   A laceration is a cut or lesion that goes through all layers of the skin and into the tissue just beneath the skin.   RISKS AND COMPLICATIONS   Most lacerations heal fully. The healing time required varies depending on location. Complications of a laceration can include pain, bleeding, infection, dehiscence (splitting open or separation of the wound  edges) and scar formation. The likelihood of complication depends on wound complexity, location, and on how the laceration occurred.   HOME CARE INSTRUCTIONS   If you were given a dressing, you should change it at least once a day, or as instructed by your caregiver. If the bandage sticks, soak it off with a solution of hydrogen peroxide or soapy water.   Twice a day, wash the area with soap and water and rinse with plain water to remove all soap. Pat (don't rub) dry with a clean towel. Look for signs of infection (see below).   Re-apply prescribed creams or ointments as instructed. This will help prevent infection. This also helps keep the bandage from sticking. Telfa over the wound and under the dressing or wrap will also help keep the bandage from sticking.   If the bandage becomes wet, dirty, or develops a foul smell, change it as soon as possible.   Only take over-the-counter or prescription medicines for pain, discomfort, or fever as directed by your caregiver.   Have your sutures, staples, or steri strips removed as instructed by your caregiver. Steri strips will peel off from the outer edges toward the center and will eventually fall off. Report to your caregiver if the strips are falling off and the wound does not appear fully healed.   SEEK  IMMEDIATE MEDICAL CARE IF:   There is redness, swelling, or increasing pain in the wound.   There is a red line that goes up your arm or leg.   Pus is coming from wound.   You develop an unexplained oral temperature above 102 F (38.9 C), or as your caregiver suggests.   You notice a foul smell coming from the wound or dressing.   There is a breaking open of the wound (edges not staying together) before or after sutures have been removed.   You notice something coming out of the wound such as wood or glass.   The wound is on your hand or foot and you find that you are unable to properly move a finger or toe.   There is severe swelling around the wound causing pain and  numbness or a change in color in your arm, hand, leg, or foot.   Pain is not controlled with medication.   There is severe swelling around the wound site.   The wound splits open and bleeding recurs.   You experience worsening numbness, weakness, or loss of function of any joint around or beyond the wound.   You develop painful lumps near the wound or on the skin anywhere on your body.   YOU MIGHT NEED A TETANUS SHOT NOW IF:   You have no idea when you had the last one.   You have never had a tetanus shot before.   Your cut had dirt in it.   If you need a tetanus shot, and you decide not to get one, there is a rare chance of getting tetanus. Sickness from tetanus can be serious. If you got a tetanus shot, your arm may swell, get red and warm to the touch at the shot site. This is common and not a problem.   MAKE SURE YOU:   Understand these instructions.   Will watch your condition.   Will get help right away if you are not doing well or get worse.   Document Released: 01/09/2007 Document Re-Released: 08/25/2009   [facility] Patient Information 2012 [place].

## 2015-08-28 NOTE — Narrator Note (Signed)
Wound sutured and dressed by PA. Pt tolerated procedure.

## 2017-04-27 ENCOUNTER — Emergency Department: Payer: PRIVATE HEALTH INSURANCE

## 2017-04-27 ENCOUNTER — Emergency Department
Admission: EM | Admit: 2017-04-27 | Discharge: 2017-04-27 | Disposition: A | Discharge status: Home / Self Care | Payer: PRIVATE HEALTH INSURANCE | Attending: Emergency Medicine | Admitting: Emergency Medicine

## 2017-04-27 ENCOUNTER — Encounter: Payer: Self-pay | Admitting: Emergency Medicine

## 2017-04-27 DIAGNOSIS — Y929 Unspecified place or not applicable: Secondary | ICD-10-CM | POA: Diagnosis not present

## 2017-04-27 DIAGNOSIS — S8992XA Unspecified injury of left lower leg, initial encounter: Secondary | ICD-10-CM | POA: Diagnosis present

## 2017-04-27 DIAGNOSIS — S82402A Unspecified fracture of shaft of left fibula, initial encounter for closed fracture: Secondary | ICD-10-CM | POA: Insufficient documentation

## 2017-04-27 DIAGNOSIS — W130XXA Fall from, out of or through balcony, initial encounter: Secondary | ICD-10-CM | POA: Insufficient documentation

## 2017-04-27 DIAGNOSIS — Y999 Unspecified external cause status: Secondary | ICD-10-CM | POA: Insufficient documentation

## 2017-04-27 DIAGNOSIS — Y9339 Activity, other involving climbing, rappelling and jumping off: Secondary | ICD-10-CM | POA: Diagnosis not present

## 2017-04-27 DIAGNOSIS — W19XXXA Unspecified fall, initial encounter: Secondary | ICD-10-CM

## 2017-04-27 MED ORDER — OXYCODONE-ACETAMINOPHEN 5-325 MG PO TABS: 1.0000 | ORAL_TABLET | Freq: Four times a day (QID) | ORAL | 0 refills | Status: DC | PRN

## 2017-04-27 MED ORDER — OXYCODONE-ACETAMINOPHEN 5-325 MG PO TABS
2.0000 | ORAL_TABLET | Freq: Once | ORAL | Status: AC
  Administered 2017-04-27: 2 via ORAL
  Filled 2017-04-27: qty 2

## 2017-04-27 MED ORDER — KETOROLAC TROMETHAMINE 60 MG/2ML IM SOLN
60.0000 mg | Freq: Once | INTRAMUSCULAR | Status: AC
  Administered 2017-04-27: 60 mg via INTRAMUSCULAR
  Filled 2017-04-27: qty 2

## 2017-04-27 NOTE — ED Triage Notes (Signed)
Pt to rm 7 via EMS from home, report fall from 2nd story balcony, unsure of LOC.  Per witnesses, pt was asleep for a couple hours but pt states he does not remember.  PT states unable to walk, pain to left lower leg, numbness to toes, cap refill brisk, pedal pulse strong, no obvious deformity or swelling

## 2017-04-27 NOTE — ED Notes (Signed)
Pt verbalized understanding of the splinting process, splint was applied by this EDT. Pt understands what adverse signs to look for.

## 2017-04-27 NOTE — ED Notes (Signed)
Report from christine, rn.  

## 2017-04-27 NOTE — ED Provider Notes (Addendum)
[facility] Emergency Department Provider Note       Time seen: ----------------------------------------- 1:12 AM on 04/27/2017 -----------------------------------------     I have reviewed the triage vital signs and the nursing notes.   HISTORY   Chief Complaint Fall    HPI Jeffery Mora is a 31 y.o. male who presents to the ED for left lower leg pain. Patient states he jumped down off of a balcony that was about 10 feet off the ground at some point tonight. He is complaining of pain in the lateral left calf. He has difficulty walking on the leg due to pain. Pain is 9 out of 10, he denies any other injuries or complaints, neck pain, back pain or head injury   No past medical history on file.  There are no active problems to display for this patient.   No past surgical history on file.  Allergies Patient has no allergy information on record.  Social History Social History  Substance Use Topics  . Smoking status: Not on file  . Smokeless tobacco: Not on file  . Alcohol use Not on file    Review of Systems Constitutional: Negative for fever. Cardiovascular: Negative for chest pain. Respiratory: Negative for shortness of breath. Gastrointestinal: Negative for abdominal pain, vomiting and diarrhea. Genitourinary: Negative for dysuria. Musculoskeletal: Positive for left leg pain Skin: Negative for rash. Neurological: Negative for headaches, focal weakness or numbness.  All systems negative/normal/unremarkable except as stated in the HPI  ____________________________________________   PHYSICAL EXAM:  VITAL SIGNS: ED Triage Vitals [04/27/17 0112]  Enc Vitals Group     BP      Pulse      Resp      Temp      Temp src      SpO2      Weight      Height      Head Circumference      Peak Flow      Pain Score 9     Pain Loc      Pain Edu?      Excl. in GC?    Constitutional: Alert and oriented. Well appearing and in no  distress. Eyes: Conjunctivae are normal. Normal extraocular movements. ENT   Head: Normocephalic and atraumatic.   Nose: No congestion/rhinnorhea.   Mouth/Throat: Mucous membranes are moist.   Neck: No stridor. Cardiovascular: Normal rate, regular rhythm. No murmurs, rubs, or gallops. Respiratory: Normal respiratory effort without tachypnea nor retractions. Breath sounds are clear and equal bilaterally. No wheezes/rales/rhonchi. Gastrointestinal: Soft and nontender. Normal bowel sounds Musculoskeletal: Tenderness noted around the left calf laterally and along the course of the left fibula. No back tenderness Neurologic:  Normal speech and language. No gross focal neurologic deficits are appreciated.  Skin:  Skin is warm, dry and intact. No rash noted. Psychiatric: Mood and affect are normal. Speech and behavior are normal.  ____________________________________________  ED COURSE:  Pertinent labs & imaging results that were available during my care of the patient were reviewed by me and considered in my medical decision making (see chart for details). Patient presents for left leg pain after a fall, we will assess with imaging as indicated.   Procedures  RADIOLOGY Images were viewed by me  Left tib-fib x-ray/left ankle xrays IMPRESSION: 1. Mildly comminuted and displaced proximal fibular fracture 2. Possible fracture through the posterior cortex of the distal tibia.  ____________________________________________  FINAL ASSESSMENT AND PLAN  Fibula fracture  Plan: Patient's imaging was dictated  above. Patient had presented for a fall 3 days only complaint was left leg pain. Compartments feel soft. He has evidence of a fibular fracture and initially was thought to have a possible posterior tibial fracture. He was placed in a sugar tong splint with crutches. He is stable for outpatient follow-up with orthopedics.   Emily Filbert, MD   Note: This note was  generated in part or whole with voice recognition software. Voice recognition is usually quite accurate but there are transcription errors that can and very often do occur. I apologize for any typographical errors that were not detected and corrected.     Emily Filbert, MD 04/27/17 1610    Emily Filbert, MD 04/27/17 [phone]

## 2019-06-02 ENCOUNTER — Other Ambulatory Visit: Payer: Self-pay

## 2019-06-02 ENCOUNTER — Inpatient Hospital Stay
Admission: EM | Admit: 2019-06-02 | Discharge: 2019-06-04 | DRG: 872 | Payer: Self-pay | Attending: Internal Medicine | Admitting: Internal Medicine

## 2019-06-02 ENCOUNTER — Encounter: Payer: Self-pay | Admitting: Emergency Medicine

## 2019-06-02 ENCOUNTER — Emergency Department: Payer: Self-pay

## 2019-06-02 DIAGNOSIS — A419 Sepsis, unspecified organism: Principal | ICD-10-CM | POA: Diagnosis present

## 2019-06-02 DIAGNOSIS — E860 Dehydration: Secondary | ICD-10-CM | POA: Diagnosis present

## 2019-06-02 DIAGNOSIS — E872 Acidosis: Secondary | ICD-10-CM | POA: Diagnosis present

## 2019-06-02 DIAGNOSIS — R002 Palpitations: Secondary | ICD-10-CM | POA: Diagnosis present

## 2019-06-02 DIAGNOSIS — R21 Rash and other nonspecific skin eruption: Secondary | ICD-10-CM | POA: Diagnosis present

## 2019-06-02 DIAGNOSIS — Z20828 Contact with and (suspected) exposure to other viral communicable diseases: Secondary | ICD-10-CM | POA: Diagnosis present

## 2019-06-02 DIAGNOSIS — Z23 Encounter for immunization: Secondary | ICD-10-CM

## 2019-06-02 DIAGNOSIS — R000 Tachycardia, unspecified: Secondary | ICD-10-CM | POA: Diagnosis present

## 2019-06-02 DIAGNOSIS — R0789 Other chest pain: Secondary | ICD-10-CM | POA: Diagnosis present

## 2019-06-02 DIAGNOSIS — F14988 Cocaine use, unspecified with other cocaine-induced disorder: Secondary | ICD-10-CM | POA: Diagnosis present

## 2019-06-02 DIAGNOSIS — M6282 Rhabdomyolysis: Secondary | ICD-10-CM | POA: Diagnosis present

## 2019-06-02 DIAGNOSIS — F17210 Nicotine dependence, cigarettes, uncomplicated: Secondary | ICD-10-CM | POA: Diagnosis present

## 2019-06-02 LAB — URINALYSIS, COMPLETE (UACMP) WITH MICROSCOPIC
Bacteria, UA: NONE SEEN
Bilirubin Urine: NEGATIVE
Glucose, UA: NEGATIVE mg/dL
Hgb urine dipstick: NEGATIVE
Ketones, ur: 20 mg/dL — AB
Leukocytes,Ua: NEGATIVE
Nitrite: NEGATIVE
Protein, ur: 100 mg/dL — AB
Specific Gravity, Urine: 1.028 (ref 1.005–1.030)
pH: 5 (ref 5.0–8.0)

## 2019-06-02 LAB — PROCALCITONIN: Procalcitonin: <0.1 ng/mL

## 2019-06-02 LAB — URINE DRUG SCREEN, QUALITATIVE (ARMC ONLY)
Amphetamines, Ur Screen: NOT DETECTED
Barbiturates, Ur Screen: NOT DETECTED
Benzodiazepine, Ur Scrn: NOT DETECTED
Cannabinoid 50 Ng, Ur ~~LOC~~: POSITIVE — AB
Cocaine Metabolite,Ur ~~LOC~~: POSITIVE — AB
MDMA (Ecstasy)Ur Screen: NOT DETECTED
Methadone Scn, Ur: NOT DETECTED
Opiate, Ur Screen: NOT DETECTED
Phencyclidine (PCP) Ur S: NOT DETECTED
Tricyclic, Ur Screen: NOT DETECTED

## 2019-06-02 LAB — COMPREHENSIVE METABOLIC PANEL
ALT: 50 U/L — ABNORMAL HIGH (ref 0–44)
AST: 56 U/L — ABNORMAL HIGH (ref 15–41)
Albumin: 4.3 g/dL (ref 3.5–5.0)
Alkaline Phosphatase: 55 U/L (ref 38–126)
Anion gap: 12 (ref 5–15)
BUN: 10 mg/dL (ref 6–20)
CO2: 22 mmol/L (ref 22–32)
Calcium: 8.5 mg/dL — ABNORMAL LOW (ref 8.9–10.3)
Chloride: 107 mmol/L (ref 98–111)
Creatinine, Ser: 0.89 mg/dL (ref 0.61–1.24)
GFR calc Af Amer: >60 mL/min (ref >60)
GFR calc non Af Amer: >60 mL/min (ref >60)
Glucose, Bld: 68 mg/dL — ABNORMAL LOW (ref 70–99)
Potassium: 3.8 mmol/L (ref 3.5–5.1)
Sodium: 141 mmol/L (ref 135–145)
Total Bilirubin: 1 mg/dL (ref 0.3–1.2)
Total Protein: 7.3 g/dL (ref 6.5–8.1)

## 2019-06-02 LAB — DIFFERENTIAL
Abs Immature Granulocytes: 0.2 10*3/uL — ABNORMAL HIGH (ref 0.00–0.07)
Basophils Absolute: 0.1 10*3/uL (ref 0.0–0.1)
Basophils Relative: 1 %
Eosinophils Absolute: 0.2 10*3/uL (ref 0.0–0.5)
Eosinophils Relative: 1 %
Immature Granulocytes: 1 %
Lymphocytes Relative: 12 %
Lymphs Abs: 2 10*3/uL (ref 0.7–4.0)
Monocytes Absolute: 1.8 10*3/uL — ABNORMAL HIGH (ref 0.1–1.0)
Monocytes Relative: 11 %
Neutro Abs: 12.3 10*3/uL — ABNORMAL HIGH (ref 1.7–7.7)
Neutrophils Relative %: 74 %

## 2019-06-02 LAB — CBC
HCT: 51.3 % (ref 39.0–52.0)
Hemoglobin: 17.9 g/dL — ABNORMAL HIGH (ref 13.0–17.0)
MCH: 32.4 pg (ref 26.0–34.0)
MCHC: 34.9 g/dL (ref 30.0–36.0)
MCV: 92.9 fL (ref 80.0–100.0)
Platelets: 333 10*3/uL (ref 150–400)
RBC: 5.52 MIL/uL (ref 4.22–5.81)
RDW: 12.3 % (ref 11.5–15.5)
WBC: 16.6 10*3/uL — ABNORMAL HIGH (ref 4.0–10.5)
nRBC: 0 % (ref 0.0–0.2)

## 2019-06-02 LAB — TROPONIN I (HIGH SENSITIVITY)
Troponin I (High Sensitivity): 13 ng/L (ref <18)
Troponin I (High Sensitivity): 18 ng/L — ABNORMAL HIGH (ref <18)

## 2019-06-02 LAB — MRSA PCR SCREENING: MRSA by PCR: NEGATIVE

## 2019-06-02 LAB — CK: Total CK: 663 U/L — ABNORMAL HIGH (ref 49–397)

## 2019-06-02 LAB — LACTIC ACID, PLASMA
Lactic Acid, Venous: 4.7 mmol/L (ref 0.5–1.9)
Lactic Acid, Venous: 3 mmol/L (ref 0.5–1.9)

## 2019-06-02 LAB — SARS CORONAVIRUS 2 BY RT PCR (HOSPITAL ORDER, PERFORMED IN ~~LOC~~ HOSPITAL LAB): SARS Coronavirus 2: NEGATIVE

## 2019-06-02 MED ORDER — ACETAMINOPHEN 325 MG PO TABS
650.0000 mg | ORAL_TABLET | Freq: Four times a day (QID) | ORAL | Status: DC | PRN
  Administered 2019-06-03 – 2019-06-04 (×2): 650 mg via ORAL
  Filled 2019-06-02 (×2): qty 2

## 2019-06-02 MED ORDER — ENOXAPARIN SODIUM 40 MG/0.4ML ~~LOC~~ SOLN
40.0000 mg | SUBCUTANEOUS | Status: DC
  Administered 2019-06-02 – 2019-06-03 (×2): 40 mg via SUBCUTANEOUS
  Filled 2019-06-02 (×2): qty 0.4

## 2019-06-02 MED ORDER — VANCOMYCIN HCL 10 G IV SOLR
2000.0000 mg | Freq: Once | INTRAVENOUS | Status: AC
  Administered 2019-06-02: 2000 mg via INTRAVENOUS
  Filled 2019-06-02: qty 2000

## 2019-06-02 MED ORDER — SODIUM CHLORIDE 0.9 % IV SOLN
INTRAVENOUS | Status: DC
  Administered 2019-06-02 – 2019-06-04 (×3): via INTRAVENOUS

## 2019-06-02 MED ORDER — VANCOMYCIN HCL 10 G IV SOLR
1750.0000 mg | Freq: Two times a day (BID) | INTRAVENOUS | Status: DC
  Administered 2019-06-03 – 2019-06-04 (×3): 1750 mg via INTRAVENOUS
  Filled 2019-06-02 (×4): qty 1750

## 2019-06-02 MED ORDER — BISACODYL 5 MG PO TBEC: 5.0000 mg | DELAYED_RELEASE_TABLET | Freq: Every day | ORAL | Status: DC | PRN

## 2019-06-02 MED ORDER — SODIUM CHLORIDE 0.9 % IV SOLN
2.0000 g | Freq: Three times a day (TID) | INTRAVENOUS | Status: DC
  Administered 2019-06-03 – 2019-06-04 (×3): 2 g via INTRAVENOUS
  Filled 2019-06-02 (×6): qty 2

## 2019-06-02 MED ORDER — ONDANSETRON HCL 4 MG/2ML IJ SOLN: 4.0000 mg | Freq: Four times a day (QID) | INTRAMUSCULAR | Status: DC | PRN

## 2019-06-02 MED ORDER — ACETAMINOPHEN 650 MG RE SUPP: 650.0000 mg | Freq: Four times a day (QID) | RECTAL | Status: DC | PRN

## 2019-06-02 MED ORDER — SODIUM CHLORIDE 0.9 % IV SOLN
2.0000 g | Freq: Once | INTRAVENOUS | Status: AC
  Administered 2019-06-02: 2 g via INTRAVENOUS
  Filled 2019-06-02: qty 2

## 2019-06-02 MED ORDER — DOXYCYCLINE HYCLATE 100 MG PO TABS
100.0000 mg | ORAL_TABLET | Freq: Once | ORAL | Status: AC
  Administered 2019-06-02: 14:00:00 100 mg via ORAL
  Filled 2019-06-02: qty 1

## 2019-06-02 MED ORDER — DOXYCYCLINE HYCLATE 100 MG PO TABS: 100.0000 mg | ORAL_TABLET | Freq: Two times a day (BID) | ORAL | Status: DC

## 2019-06-02 MED ORDER — SODIUM CHLORIDE 0.9 % IV SOLN
2.0000 g | INTRAVENOUS | Status: DC
  Administered 2019-06-02: 14:00:00 2 g via INTRAVENOUS
  Filled 2019-06-02 (×2): qty 20

## 2019-06-02 MED ORDER — ONDANSETRON HCL 4 MG PO TABS: 4.0000 mg | ORAL_TABLET | Freq: Four times a day (QID) | ORAL | Status: DC | PRN

## 2019-06-02 MED ORDER — PNEUMOCOCCAL VAC POLYVALENT 25 MCG/0.5ML IJ INJ
0.5000 mL | INJECTION | INTRAMUSCULAR | Status: AC
  Administered 2019-06-03: 0.5 mL via INTRAMUSCULAR
  Filled 2019-06-02: qty 0.5

## 2019-06-02 MED ORDER — DOCUSATE SODIUM 100 MG PO CAPS
100.0000 mg | ORAL_CAPSULE | Freq: Two times a day (BID) | ORAL | Status: DC
  Administered 2019-06-03 (×2): 100 mg via ORAL
  Filled 2019-06-02 (×2): qty 1

## 2019-06-02 MED ORDER — SODIUM CHLORIDE 0.9 % IV SOLN
500.0000 mg | INTRAVENOUS | Status: DC
  Administered 2019-06-02: 16:00:00 500 mg via INTRAVENOUS
  Filled 2019-06-02 (×2): qty 500

## 2019-06-02 MED ORDER — SODIUM CHLORIDE 0.9 % IV BOLUS
1000.0000 mL | Freq: Once | INTRAVENOUS | Status: AC
  Administered 2019-06-02: 14:00:00 1000 mL via INTRAVENOUS

## 2019-06-02 NOTE — ED Notes (Signed)
ED TO INPATIENT HANDOFF REPORT  ED Nurse Name and Phone #:  Elmo Putt [phone]  S Name/Age/Gender Jeffery Mora 33 y.o. male Room/Bed: ED13A/ED13A  Code Status   Code Status: Full Code  Home/SNF/Other Home Patient oriented to: self, place, time and situation Is this baseline? Yes   Triage Complete: Triage complete  Chief Complaint fever  Triage Note Pt here with BPD.  Brought [facility].  Pt admitted to fever, cough and chest pain.  Also c/o SHOB.  Tachy in triage with low grade temp. BPD will remain with pt. Pt has been having left sided chest pain, to midaxillary area. Pt states "I thought I was having a heart attack yesterday". Tachypnea noted with exertion but eased when sitting.  Pt also has started with rash to trunk that is brown in color.   Allergies Allergies  Allergen Reactions  . Bee Venom Hives    Level of Care/Admitting Diagnosis ED Disposition    ED Disposition Condition [facility]: [place] [[id number]]  Level of Care: Telemetry [5]  Covid Evaluation: Confirmed COVID Negative  Diagnosis: Sepsis [facility]) [[id number]]  Admitting Physician: Epifanio Lesches [[id number]]  Attending Physician: Epifanio Lesches [phone]  PT Class (Do Not Modify): Observation [104]  PT Acc Code (Do Not Modify): Observation [10022]       B Medical/Surgery History History reviewed. No pertinent past medical history. History reviewed. No pertinent surgical history.   A IV Location/Drains/Wounds Patient Lines/Drains/Airways Status   Active Line/Drains/Airways    Name:   Placement date:   Placement time:   Site:   Days:   Peripheral IV 06/02/19 Right Antecubital   06/02/19    1311    Antecubital   less than 1          Intake/Output Last 24 hours  Intake/Output Summary (Last 24 hours) at 06/02/2019 1907 Last data filed at 06/02/2019 1638 Gross per 24 hour  Intake 1350 ml  Output -  Net 1350 ml     Labs/Imaging Results for orders placed or performed during the hospital encounter of 06/02/19 (from the past 48 hour(s))  CBC     Status: Abnormal   Collection Time: 06/02/19  1:06 PM  Result Value Ref Range   WBC 16.6 (H) 4.0 - 10.5 K/uL   RBC 5.52 4.22 - 5.81 MIL/uL   Hemoglobin 17.9 (H) 13.0 - 17.0 g/dL   HCT 51.3 39.0 - 52.0 %   MCV 92.9 80.0 - 100.0 fL   MCH 32.4 26.0 - 34.0 pg   MCHC 34.9 30.0 - 36.0 g/dL   RDW 12.3 11.5 - 15.5 %   Platelets 333 150 - 400 K/uL   nRBC 0.0 0.0 - 0.2 %    Comment: Performed at [facility], [place], [place] [address]  Troponin I (High Sensitivity)     Status: None   Collection Time: 06/02/19  1:06 PM  Result Value Ref Range   Troponin I (High Sensitivity) 13 <18 ng/L    Comment: (NOTE) Elevated high sensitivity troponin I (hsTnI) values and significant  changes across serial measurements may suggest ACS but many other  chronic and acute conditions are known to elevate hsTnI results.  Refer to the "Links" section for chest pain algorithms and additional  guidance. Performed at [facility], [address], [place], [place] [address]   Differential     Status: Abnormal   Collection Time: 06/02/19  1:06 PM  Result Value Ref Range   Neutrophils Relative % 74 %   Neutro Abs 12.3 (H) 1.7 - 7.7 K/uL   Lymphocytes Relative 12 %   Lymphs Abs 2.0 0.7 - 4.0 K/uL   Monocytes Relative 11 %   Monocytes Absolute 1.8 (H) 0.1 - 1.0 K/uL   Eosinophils Relative 1 %   Eosinophils Absolute 0.2 0.0 - 0.5 K/uL   Basophils Relative 1 %   Basophils Absolute 0.1 0.0 - 0.1 K/uL   Immature Granulocytes 1 %   Abs Immature Granulocytes 0.20 (H) 0.00 - 0.07 K/uL    Comment: Performed at [facility], [address] [place], [place] [address]  Lactic acid, plasma     Status: Abnormal   Collection Time: 06/02/19  1:10 PM  Result Value Ref Range   Lactic Acid, Venous 4.7 (HH) 0.5 - 1.9 mmol/L    Comment: CRITICAL  RESULT CALLED TO, READ BACK BY AND VERIFIED WITH Alaycia Eardley 06/02/19 @ 1348  MLK Performed at [facility], [address] [place], [place] [address]   Urinalysis, Complete w Microscopic     Status: Abnormal   Collection Time: 06/02/19  1:10 PM  Result Value Ref Range   Color, Urine AMBER (A) YELLOW    Comment: BIOCHEMICALS MAY BE AFFECTED BY COLOR   APPearance HAZY (A) CLEAR   Specific Gravity, Urine 1.028 1.005 - 1.030   pH 5.0 5.0 - 8.0   Glucose, UA NEGATIVE NEGATIVE mg/dL   Hgb urine dipstick NEGATIVE NEGATIVE   Bilirubin Urine NEGATIVE NEGATIVE   Ketones, ur 20 (A) NEGATIVE mg/dL   Protein, ur 098100 (A) NEGATIVE mg/dL   Nitrite NEGATIVE NEGATIVE   Leukocytes,Ua NEGATIVE NEGATIVE   RBC / HPF 0-5 0 - 5 RBC/hpf   WBC, UA 21-50 0 - 5 WBC/hpf   Bacteria, UA NONE SEEN NONE SEEN   Squamous Epithelial / LPF 0-5 0 - 5   Mucus PRESENT    Hyaline Casts, UA PRESENT     Comment: Performed at [facility], [address] [place], [place] [address]  Urine Drug Screen, Qualitative ([facility] only)     Status: Abnormal   Collection Time: 06/02/19  1:10 PM  Result Value Ref Range   Tricyclic, Ur Screen NONE DETECTED NONE DETECTED   Amphetamines, Ur Screen NONE DETECTED NONE DETECTED   MDMA (Ecstasy)Ur Screen NONE DETECTED NONE DETECTED   Cocaine Metabolite,Ur [place] POSITIVE (A) NONE DETECTED   Opiate, Ur Screen NONE DETECTED NONE DETECTED   Phencyclidine (PCP) Ur S NONE DETECTED NONE DETECTED   Cannabinoid 50 Ng, Ur  POSITIVE (A) NONE DETECTED   Barbiturates, Ur Screen NONE DETECTED NONE DETECTED   Benzodiazepine, Ur Scrn NONE DETECTED NONE DETECTED   Methadone Scn, Ur NONE DETECTED NONE DETECTED    Comment: (NOTE) Tricyclics + metabolites, urine    Cutoff 1000 ng/mL Amphetamines + metabolites, urine  Cutoff 1000 ng/mL MDMA (Ecstasy), urine              Cutoff 500 ng/mL Cocaine Metabolite, urine          Cutoff 300 ng/mL Opiate + metabolites, urine        Cutoff 300  ng/mL Phencyclidine (PCP), urine         Cutoff 25 ng/mL Cannabinoid, urine                 Cutoff 50 ng/mL Barbiturates + metabolites, urine  Cutoff 200 ng/mL Benzodiazepine, urine  Cutoff 200 ng/mL Methadone, urine                   Cutoff 300 ng/mL The urine drug screen provides only a preliminary, unconfirmed analytical test result and should not be used for non-medical purposes. Clinical consideration and professional judgment should be applied to any positive drug screen result due to possible interfering substances. A more specific alternate chemical method must be used in order to obtain a confirmed analytical result. Gas chromatography / mass spectrometry (GC/MS) is the preferred confirmat ory method. Performed at [facility], [address] [place], [place] [address]   SARS Coronavirus 2 (CEPHEID- Performed in [facility] hospital lab), Hosp Order     Status: None   Collection Time: 06/02/19  1:39 PM   Specimen: Nasopharyngeal Swab  Result Value Ref Range   SARS Coronavirus 2 NEGATIVE NEGATIVE    Comment: (NOTE) If result is NEGATIVE SARS-CoV-2 target nucleic acids are NOT DETECTED. The SARS-CoV-2 RNA is generally detectable in upper and lower  respiratory specimens during the acute phase of infection. The lowest  concentration of SARS-CoV-2 viral copies this assay can detect is 250  copies / mL. A negative result does not preclude SARS-CoV-2 infection  and should not be used as the sole basis for treatment or other  patient management decisions.  A negative result may occur with  improper specimen collection / handling, submission of specimen other  than nasopharyngeal swab, presence of viral mutation(s) within the  areas targeted by this assay, and inadequate number of viral copies  (<250 copies / mL). A negative result must be combined with clinical  observations, patient history, and epidemiological information. If result is  POSITIVE SARS-CoV-2 target nucleic acids are DETECTED. The SARS-CoV-2 RNA is generally detectable in upper and lower  respiratory specimens dur ing the acute phase of infection.  Positive  results are indicative of active infection with SARS-CoV-2.  Clinical  correlation with patient history and other diagnostic information is  necessary to determine patient infection status.  Positive results do  not rule out bacterial infection or co-infection with other viruses. If result is PRESUMPTIVE POSTIVE SARS-CoV-2 nucleic acids MAY BE PRESENT.   A presumptive positive result was obtained on the submitted specimen  and confirmed on repeat testing.  While 2019 novel coronavirus  (SARS-CoV-2) nucleic acids may be present in the submitted sample  additional confirmatory testing may be necessary for epidemiological  and / or clinical management purposes  to differentiate between  SARS-CoV-2 and other Sarbecovirus currently known to infect humans.  If clinically indicated additional testing with an alternate test  methodology [phone]([id number]) is advised. The SARS-CoV-2 RNA is generally  detectable in upper and lower respiratory sp ecimens during the acute  phase of infection. The expected result is Negative. Fact Sheet for Patients:  BoilerBrush.com.cyhttps://www.fda.gov/media/136312/download Fact Sheet for Healthcare Providers: https://pope.com/https://www.fda.gov/media/136313/download This test is not yet approved or cleared by the [place] FDA and has been authorized for detection and/or diagnosis of SARS-CoV-2 by FDA under an Emergency Use Authorization (EUA).  This EUA will remain in effect (meaning this test can be used) for the duration of the COVID-19 declaration under Section 564(b)(1) of the Act, 21 U.S.C. section 360bbb-3(b)(1), unless the authorization is terminated or revoked sooner. Performed at [facility], [address] [place], [place] [address]   Troponin I (High Sensitivity)     Status: Abnormal    Collection Time: 06/02/19  3:06 PM  Result Value Ref Range  Troponin I (High Sensitivity) 18 (H) <18 ng/L    Comment: (NOTE) Elevated high sensitivity troponin I (hsTnI) values and significant  changes across serial measurements may suggest ACS but many other  chronic and acute conditions are known to elevate hsTnI results.  Refer to the "Links" section for chest pain algorithms and additional  guidance. Performed at [facility], [address] [place], [place] [address]   Lactic acid, plasma     Status: Abnormal   Collection Time: 06/02/19  3:10 PM  Result Value Ref Range   Lactic Acid, Venous 3.0 (HH) 0.5 - 1.9 mmol/L    Comment: CRITICAL RESULT CALLED TO, READ BACK BY AND VERIFIED WITH Ellyse Rotolo 06/02/19 @ 1602  MLK Performed at [facility], [address] [place], [place] [address]    Dg Chest 2 View  Result Date: 06/02/2019 CLINICAL DATA:  Positive COVID-19 screening test. Fever, cough and chest pain. EXAM: CHEST - 2 VIEW COMPARISON:  None. FINDINGS: The heart size and mediastinal contours are normal. The lungs are clear. There is no pleural effusion or pneumothorax. No acute osseous findings are identified. IMPRESSION: No active cardiopulmonary process. Electronically Signed   By: Carey Bullocks M.D.   On: 06/02/2019 14:02    Pending Labs Unresulted Labs (From admission, onward)    Start     Ordered   06/09/19 0500  Creatinine, serum  (enoxaparin (LOVENOX)    CrCl >/= 30 ml/min)  Weekly,   STAT    Comments: while on enoxaparin therapy    06/02/19 1903   06/03/19 0500  Basic metabolic panel  Tomorrow morning,   STAT     06/02/19 1903   06/03/19 0500  CBC  Tomorrow morning,   STAT     06/02/19 1903   06/02/19 1904  Lyme disease, western blot  Once,   STAT     06/02/19 1903   06/02/19 1903  Rocky mtn spotted fvr abs pnl(IgG+IgM)  Once,   STAT     06/02/19 1903   06/02/19 1902  Urine culture  Once,   STAT     06/02/19 1903   06/02/19 1901   HIV antibody (Routine Testing)  Once,   STAT     06/02/19 1903   06/02/19 1901  CBC  (enoxaparin (LOVENOX)    CrCl >/= 30 ml/min)  Once,   STAT    Comments: Baseline for enoxaparin therapy IF NOT ALREADY DRAWN.  Notify MD if PLT < 100 K.    06/02/19 1903   06/02/19 1901  Creatinine, serum  (enoxaparin (LOVENOX)    CrCl >/= 30 ml/min)  Once,   STAT    Comments: Baseline for enoxaparin therapy IF NOT ALREADY DRAWN.    06/02/19 1903   06/02/19 1348  CK  ONCE - STAT,   STAT     06/02/19 1347   06/02/19 1339  Comprehensive metabolic panel  ONCE - STAT,   STAT     06/02/19 1338   06/02/19 1339  Blood Culture (routine x 2)  BLOOD CULTURE X 2,   STAT     06/02/19 1338   06/02/19 1339  Urine culture  ONCE - STAT,   STAT     06/02/19 1338   06/02/19 1339  Procalcitonin  ONCE - STAT,   STAT     06/02/19 1338          Vitals/Pain Today's Vitals   06/02/19 1700 06/02/19 1704 06/02/19 1730 06/02/19 1900  BP: 124/85  122/82 131/86  Pulse: 99  (!) 103 (!) 109  Resp: (!) 21  (!) 22 13  Temp:      TempSrc:      SpO2: 96%  97% 96%  Weight:      Height:      PainSc:  0-No pain      Isolation Precautions Airborne and Contact precautions  Medications Medications  cefTRIAXone (ROCEPHIN) 2 g in sodium chloride 0.9 % 100 mL IVPB (0 g Intravenous Stopped 06/02/19 1433)  azithromycin (ZITHROMAX) 500 mg in sodium chloride 0.9 % 250 mL IVPB (0 mg Intravenous Stopped 06/02/19 1638)  0.9 %  sodium chloride infusion (has no administration in time range)  acetaminophen (TYLENOL) tablet 650 mg (has no administration in time range)    Or  acetaminophen (TYLENOL) suppository 650 mg (has no administration in time range)  docusate sodium (COLACE) capsule 100 mg (has no administration in time range)  bisacodyl (DULCOLAX) EC tablet 5 mg (has no administration in time range)  ondansetron (ZOFRAN) tablet 4 mg (has no administration in time range)    Or  ondansetron (ZOFRAN) injection 4 mg (has no  administration in time range)  enoxaparin (LOVENOX) injection 40 mg (has no administration in time range)  doxycycline (VIBRA-TABS) tablet 100 mg (has no administration in time range)  sodium chloride 0.9 % bolus 1,000 mL (0 mLs Intravenous Stopped 06/02/19 1533)  doxycycline (VIBRA-TABS) tablet 100 mg (100 mg Oral Given 06/02/19 1403)    Mobility walks Low fall risk   Focused Assessments Cardiac Assessment Handoff:  Cardiac Rhythm: Normal sinus rhythm, Sinus tachycardia No results found for: CKTOTAL, CKMB, CKMBINDEX, TROPONINI No results found for: DDIMER Does the Patient currently have chest pain? No   , Pulmonary Assessment Handoff:  Lung sounds:  clear bil    R Recommendations: See Admitting Provider Note  Report given to:   Additional Notes:

## 2019-06-02 NOTE — ED Notes (Signed)
Date and time results received: 06/02/19 1:46 PM  Test: lactic acid Critical Value: 4.7  Name of Provider Notified: Dr. Quentin Cornwall  Orders Received? Or Actions Taken?: no new orders at this time

## 2019-06-02 NOTE — Consult Note (Signed)
Pharmacy Antibiotic Note  Jeffery Mora is a 33 y.o. male admitted on 06/02/2019 with sepsis of unknown origin.  Pharmacy has been consulted for Vancomycin and cefepime dosing. WBC: 16.6  Lactate: 3.0  Tmax: 100.3   PCT <0.10  Plan: Vancomycin 2000mg  IV x 1 dose ordered. Will start Vancomycin 1750 mg IV Q 12 hrs. Goal AUC 400-550. Expected AUC: 483 SCr used: 0.89  Start Cefepime 2g IV every 8 hours    Height: 6' (182.9 cm) Weight: 200 lb (90.7 kg) IBW/kg (Calculated) : 77.6  Temp (24hrs), Avg:99 F (37.2 C), Min:98.3 F (36.8 C), Max:100.3 F (37.9 C)  Recent Labs  Lab 06/02/19 1306 06/02/19 1310 06/02/19 1510 06/02/19 2104  WBC 16.6*  --   --   --   CREATININE  --   --   --  0.89  LATICACIDVEN  --  4.7* 3.0*  --     Estimated Creatinine Clearance: 128.4 mL/min (by C-G formula based on SCr of 0.89 mg/dL).    Allergies  Allergen Reactions  . Bee Venom Hives   Antimicrobials this admission: 7/22 Doxy/CTX/Azithromycin >> x1 7/22 Vancomycin  >>  7/22 cefepime>>  Microbiology results: 7/22 BCx: pending 7/22 UCx: pending  7/22 MRSA PCR: negative 7/22 SARS Coronavirus 2: negative   Thank you for allowing pharmacy to be a part of this patient's care.  Pernell Dupre, PharmD, BCPS Clinical Pharmacist 06/02/2019 10:56 PM

## 2019-06-02 NOTE — ED Triage Notes (Addendum)
Pt here with BPD.  [facility]  Pt admitted to fever, cough and chest pain.  Also c/o SHOB.  Tachy in triage with low grade temp. BPD will remain with pt. Pt has been having left sided chest pain, to midaxillary area. Pt states "I thought I was having a heart attack yesterday". Tachypnea noted with exertion but eased when sitting.  Pt also has started with rash to trunk that is brown in color.

## 2019-06-02 NOTE — ED Provider Notes (Signed)
[facility] Emergency Department Provider Note    First MD Initiated Contact with Patient 06/02/19 1337     (approximate)  I have reviewed the triage vital signs and the nursing notes.   HISTORY  Chief Complaint Chest Pain    HPI Jeffery Mora is a 33 y.o. male no significant past medical history but does smoke cigarettes daily presents the ER for fever chills and chest discomfort is been ongoing for the past 2 to 3 days.  Patient is actually in police custody and was brought to the ER because he was tachycardic and febrile for medical clearance.  Has not had any nausea or vomiting.  No diarrhea.  States he was having left hand pain and had noticed a new rash on his upper extremities is splotchy over the past 2 to 3 days.  He denies any tick bites.  Does not work outside.  Is not been around anyone that is been sick.  No known COVID-19 contacts.  No new medications.  Denies IV drug abuse.    History reviewed. No pertinent past medical history. History reviewed. No pertinent family history. History reviewed. No pertinent surgical history. There are no active problems to display for this patient.     Prior to Admission medications   Not on File    Allergies Bee venom    Social History Social History   Tobacco Use  . Smoking status: Current Every Day Smoker  . Smokeless tobacco: Never Used  Substance Use Topics  . Alcohol use: Yes  . Drug use: Not on file    Review of Systems Patient denies headaches, rhinorrhea, blurry vision, numbness, shortness of breath, chest pain, edema, cough, abdominal pain, nausea, vomiting, diarrhea, dysuria, fevers, rashes or hallucinations unless otherwise stated above in HPI. ____________________________________________   PHYSICAL EXAM:  VITAL SIGNS: Vitals:   06/02/19 1430 06/02/19 1500  BP: (!) 137/94 135/87  Pulse: (!) 120 (!) 108  Resp: 16 (!) 21  Temp:    SpO2: 98% 94%    Constitutional: Alert  and oriented.  Eyes: Conjunctivae are normal.  Head: Atraumatic. Nose: No congestion/rhinnorhea. Mouth/Throat: Mucous membranes are moist.   Neck: No stridor. Painless ROM.  Cardiovascular: Normal rate, regular rhythm. Grossly normal heart sounds.  Good peripheral circulation. Respiratory: Normal respiratory effort.  No retractions. Lungs CTAB. Gastrointestinal: Soft and nontender. No distention. No abdominal bruits. No CVA tenderness. Genitourinary:  Musculoskeletal: No lower extremity tenderness nor edema.  No joint effusions. Neurologic:  Normal speech and language. No gross focal neurologic deficits are appreciated. No facial droop Skin:  Skin is warm, dry and intact. Splotchy reticular macules coalescing to BUE, no petechia Psychiatric: Mood and affect are normal. Speech and behavior are normal.  ____________________________________________   LABS (all labs ordered are listed, but only abnormal results are displayed)  Results for orders placed or performed during the hospital encounter of 06/02/19 (from the past 24 hour(s))  CBC     Status: Abnormal   Collection Time: 06/02/19  1:06 PM  Result Value Ref Range   WBC 16.6 (H) 4.0 - 10.5 K/uL   RBC 5.52 4.22 - 5.81 MIL/uL   Hemoglobin 17.9 (H) 13.0 - 17.0 g/dL   HCT 51.3 39.0 - 52.0 %   MCV 92.9 80.0 - 100.0 fL   MCH 32.4 26.0 - 34.0 pg   MCHC 34.9 30.0 - 36.0 g/dL   RDW 12.3 11.5 - 15.5 %   Platelets 333 150 - 400 K/uL  nRBC 0.0 0.0 - 0.2 %  Troponin I (High Sensitivity)     Status: None   Collection Time: 06/02/19  1:06 PM  Result Value Ref Range   Troponin I (High Sensitivity) 13 <18 ng/L  Differential     Status: Abnormal   Collection Time: 06/02/19  1:06 PM  Result Value Ref Range   Neutrophils Relative % 74 %   Neutro Abs 12.3 (H) 1.7 - 7.7 K/uL   Lymphocytes Relative 12 %   Lymphs Abs 2.0 0.7 - 4.0 K/uL   Monocytes Relative 11 %   Monocytes Absolute 1.8 (H) 0.1 - 1.0 K/uL   Eosinophils Relative 1 %    Eosinophils Absolute 0.2 0.0 - 0.5 K/uL   Basophils Relative 1 %   Basophils Absolute 0.1 0.0 - 0.1 K/uL   Immature Granulocytes 1 %   Abs Immature Granulocytes 0.20 (H) 0.00 - 0.07 K/uL  Lactic acid, plasma     Status: Abnormal   Collection Time: 06/02/19  1:10 PM  Result Value Ref Range   Lactic Acid, Venous 4.7 (HH) 0.5 - 1.9 mmol/L  Urinalysis, Complete w Microscopic     Status: Abnormal   Collection Time: 06/02/19  1:10 PM  Result Value Ref Range   Color, Urine AMBER (A) YELLOW   APPearance HAZY (A) CLEAR   Specific Gravity, Urine 1.028 1.005 - 1.030   pH 5.0 5.0 - 8.0   Glucose, UA NEGATIVE NEGATIVE mg/dL   Hgb urine dipstick NEGATIVE NEGATIVE   Bilirubin Urine NEGATIVE NEGATIVE   Ketones, ur 20 (A) NEGATIVE mg/dL   Protein, ur 657100 (A) NEGATIVE mg/dL   Nitrite NEGATIVE NEGATIVE   Leukocytes,Ua NEGATIVE NEGATIVE   RBC / HPF 0-5 0 - 5 RBC/hpf   WBC, UA 21-50 0 - 5 WBC/hpf   Bacteria, UA NONE SEEN NONE SEEN   Squamous Epithelial / LPF 0-5 0 - 5   Mucus PRESENT    Hyaline Casts, UA PRESENT    ____________________________________________  EKG My review and personal interpretation at Time: 13:00   Indication: sepsis  Rate: 160  Rhythm: sinus Axis: normal Other: normal intervals, no stemi, nonspecific st changes likely 2/2 rate ____________________________________________  RADIOLOGY  I personally reviewed all radiographic images ordered to evaluate for the above acute complaints and reviewed radiology reports and findings.  These findings were personally discussed with the patient.  Please see medical record for radiology report.  ____________________________________________   PROCEDURES  Procedure(s) performed:  .Critical Care Performed by: Willy Eddyobinson, Elgie Landino, MD Authorized by: Willy Eddyobinson, Remo Kirschenmann, MD   Critical care provider statement:    Critical care time (minutes):  30   Critical care time was exclusive of:  Separately billable procedures and treating other  patients   Critical care was necessary to treat or prevent imminent or life-threatening deterioration of the following conditions:  Sepsis   Critical care was time spent personally by me on the following activities:  Development of treatment plan with patient or surrogate, discussions with consultants, evaluation of patient's response to treatment, examination of patient, obtaining history from patient or surrogate, ordering and performing treatments and interventions, ordering and review of laboratory studies, ordering and review of radiographic studies, pulse oximetry, re-evaluation of patient's condition and review of old charts      Critical Care performed: Yes ____________________________________________   INITIAL IMPRESSION / ASSESSMENT AND PLAN / ED COURSE  Pertinent labs & imaging results that were available during my care of the patient were reviewed by me and considered  in my medical decision making (see chart for details).   DDX: Sepsis, pneumonia, bacteremia, COVID-19, UTI, enteritis, ACS, dysrhythmia, electrolyte abnormality, RMSF  Jeffery Mora is a 33 y.o. who presents to the ED with symptoms as described above.  Patient febrile tachycardic.  Protecting his airway without any hypoxia.  His abdominal exam is soft benign.  Blood work sent for above differential.  Will provide IV fluids.  The patient will be placed on continuous pulse oximetry and telemetry for monitoring.  Laboratory evaluation will be sent to evaluate for the above complaints.     Clinical Course as of Jun 01 1518  Wed Jun 02, 2019  1515 Heart rate is improving after IV resuscitation.  Patient given doxycycline for possible RMSF.  Given his left chest wall pain also possible for community-acquired pneumonia.  Not sure what to make of this rash.  Possible COVID.  Lower suspicion for endocarditis.  Will discuss with hospitalist for admission for further evaluation and management.   [PR]    Clinical Course User  Index [PR] Willy Eddyobinson, Keeva Reisen, MD    The patient was evaluated in Emergency Department today for the symptoms described in the history of present illness. He/she was evaluated in the context of the global COVID-19 pandemic, which necessitated consideration that the patient might be at risk for infection with the SARS-CoV-2 virus that causes COVID-19. Institutional protocols and algorithms that pertain to the evaluation of patients at risk for COVID-19 are in a state of rapid change based on information released by regulatory bodies including the CDC and federal and state organizations. These policies and algorithms were followed during the patient's care in the ED.  As part of my medical decision making, I reviewed the following data within the electronic MEDICAL RECORD NUMBER Nursing notes reviewed and incorporated, Labs reviewed, notes from prior ED visits and [place] Controlled Substance Database   ____________________________________________   FINAL CLINICAL IMPRESSION(S) / ED DIAGNOSES  Final diagnoses:  Sepsis, due to unspecified organism, unspecified whether acute organ dysfunction present [facility](HCC)      NEW MEDICATIONS STARTED DURING THIS VISIT:  New Prescriptions   No medications on file     Note:  This document was prepared using Dragon voice recognition software and may include unintentional dictation errors.    Willy Eddyobinson, Daisa Stennis, MD 06/02/19 [id number]

## 2019-06-02 NOTE — ED Notes (Signed)
Date and time results received: 06/02/19 4:01 PM  Test: lactic acid (repeat) Critical Value: 3.0  Name of Provider Notified: Dr. Quentin Cornwall  Orders Received? Or Actions Taken?: no new orders at this time

## 2019-06-02 NOTE — H&P (Addendum)
[facility] [place] NAME: Jeffery Mora    MR#:  [id number]  DATE OF BIRTH:  Dec 03, 1985  DATE OF ADMISSION:  06/02/2019  PRIMARY CARE PHYSICIAN: Patient, No Pcp Per   REQUESTING/REFERRING PHYSICIAN: Dr. Merlyn Lot  CHIEF COMPLAINT: Brought in from jail because of fever   Chief Complaint  Patient presents with  . Chest Pain    HISTORY OF PRESENT ILLNESS:  Jeffery Mora  is a 33 y.o. male with known history comes from jail because of fever, cough for chlamydia screening.  Patient was very tachycardic with heart rate up to 160 bpm when he came.  Also complained of palpitations, left-sided chest pain.  Patient COVID screen negative, received IV fluids in the emergency room, tachycardia had decreased heart rate is like in the 100s.  Patient also feels better.  No joint pains, no shortness of breath, occasional cough. Has faint maculopapular rash all over the body. PAST MEDICAL HISTORY:  History reviewed. No pertinent past medical history.  PAST SURGICAL HISTOIRY:  History reviewed. No pertinent surgical history.  SOCIAL HISTORY:   Social History   Tobacco Use  . Smoking status: Current Every Day Smoker  . Smokeless tobacco: Never Used  Substance Use Topics  . Alcohol use: Yes    FAMILY HISTORY:  History reviewed. No pertinent family history.  DRUG ALLERGIES:   Allergies  Allergen Reactions  . Bee Venom Hives    REVIEW OF SYSTEMS:  CONSTITUTIONAL: Low-grade temperature, anxious EYES: No blurred or double vision.  EARS, NOSE, AND THROAT: No tinnitus or ear pain.  RESPIRATORY no shortness of breath, dry cough.  CARDIOVASCULAR: No chest pain, orthopnea, edema.  GASTROINTESTINAL: No nausea, vomiting, diarrhea or abdominal pain.  GENITOURINARY: No dysuria, hematuria.  ENDOCRINE: No polyuria, nocturia,  HEMATOLOGY: No anemia, easy bruising or bleeding SKIN: Rash all over the body. MUSCULOSKELETAL: No joint  pain or arthritis.   NEUROLOGIC: No tingling, numbness, weakness.  PSYCHIATRY: No anxiety or depression.   MEDICATIONS AT HOME:   Prior to Admission medications   Not on File      VITAL SIGNS:  Blood pressure 131/86, pulse (!) 109, temperature 100.3 F (37.9 C), temperature source Oral, resp. rate 13, height 6' (1.829 m), weight 90.7 kg, SpO2 96 %.  PHYSICAL EXAMINATION:  GENERAL:  33 y.o.-year-old patient lying in the bed with no acute distress.  EYES: Pupils equal, round, reactive to light and accommodation. No scleral icterus. Extraocular muscles intact.  HEENT: Head atraumatic, normocephalic. Oropharynx and nasopharynx clear.  NECK:  Supple, no jugular venous distention. No thyroid enlargement, no tenderness.  LUNGS: Normal breath sounds bilaterally, no wheezing, rales,rhonchi or crepitation. No use of accessory muscles of respiration.  CARDIOVASCULAR: S1, S2 normal. No murmurs, rubs, or gallops.  ABDOMEN: Soft, nontender, nondistended. Bowel sounds present. No organomegaly or mass.  EXTREMITIES: No pedal edema, cyanosis, or clubbing.  NEUROLOGIC: Cranial nerves II through XII are intact. Muscle strength 5/5 in all extremities. Sensation intact. Gait not checked.  PSYCHIATRIC: The patient is alert and oriented x 3.  SKIN: Very faint maculopapular rash involving sides of the body, denies any itching.  LABORATORY PANEL:   CBC Recent Labs  Lab 06/02/19 1306  WBC 16.6*  HGB 17.9*  HCT 51.3  PLT 333   ------------------------------------------------------------------------------------------------------------------  Chemistries  No results for input(s): NA, K, CL, CO2, GLUCOSE, BUN, CREATININE, CALCIUM, MG, AST, ALT, ALKPHOS, BILITOT in the last 168 hours.  Invalid input(s): GFRCGP ------------------------------------------------------------------------------------------------------------------  Cardiac Enzymes No results for input(s): TROPONINI in the last 168  hours. ------------------------------------------------------------------------------------------------------------------  RADIOLOGY:  Dg Chest 2 View  Result Date: 06/02/2019 CLINICAL DATA:  Positive COVID-19 screening test. Fever, cough and chest pain. EXAM: CHEST - 2 VIEW COMPARISON:  None. FINDINGS: The heart size and mediastinal contours are normal. The lungs are clear. There is no pleural effusion or pneumothorax. No acute osseous findings are identified. IMPRESSION: No active cardiopulmonary process. Electronically Signed   By: Carey BullocksWilliam  Veazey M.D.   On: 06/02/2019 14:02    EKG:   Orders placed or performed during the hospital encounter of 06/02/19  . EKG 12-Lead  . EKG 12-Lead  . ED EKG  . ED EKG    IMPRESSION AND PLAN:  33 year old male patient with fever, tachycardia, faint maculopapular rash, COVID-19 screen is negative, 1.  Sepsis of unclear origin, patient had elevated lactic acid, fever, elevated white count, does have a faint maculopapular rash concerning for viral exanthema.  Empirically give doxycycline, follow blood cultures, white count tomorrow.  We will also start aggressive hydration.  Patient received Rocephin, Zithromax, 1 dose of doxycycline in the emergency room. 2.  Sinus tachycardia likely due to dehydration, sepsis: Improved with IV fluids.  Continue IV fluids.  #3 chest pain due to palpitations.  Patient chest pain-free at this time.  Patient high sensitive troponin is 13 and 18. Likely discharge back to jail tomorrow.    All the records are reviewed and case discussed with ED provider. Management plans discussed with the patient, family and they are in agreement.  CODE STATUS: full  TOTAL TIME TAKING CARE OF THIS PATIENT: 50 minutes.    Katha HammingSnehalatha Coni Homesley M.D on 06/02/2019 at 7:26 PM  Between 7am to 6pm - Pager - [phone]  After 6pm go to [email or web address] - password [email or web address]  [facility] [place] [facility]  Office  [phone]  CC: Primary  care physician; Patient, No Pcp Per  Note: This dictation was prepared with Dragon dictation along with smaller phrase technology. Any transcriptional errors that result from this process are unintentional.

## 2019-06-03 ENCOUNTER — Observation Stay: Payer: Self-pay

## 2019-06-03 LAB — TROPONIN I (HIGH SENSITIVITY): Troponin I (High Sensitivity): 11 ng/L (ref <18)

## 2019-06-03 LAB — LACTIC ACID, PLASMA: Lactic Acid, Venous: 1.7 mmol/L (ref 0.5–1.9)

## 2019-06-03 LAB — RESPIRATORY PANEL BY PCR

## 2019-06-03 LAB — BASIC METABOLIC PANEL
Anion gap: 12 (ref 5–15)
BUN: 10 mg/dL (ref 6–20)
CO2: 20 mmol/L — ABNORMAL LOW (ref 22–32)
Calcium: 8.6 mg/dL — ABNORMAL LOW (ref 8.9–10.3)
Chloride: 107 mmol/L (ref 98–111)
Creatinine, Ser: 0.86 mg/dL (ref 0.61–1.24)
GFR calc Af Amer: >60 mL/min (ref >60)
GFR calc non Af Amer: >60 mL/min (ref >60)
Glucose, Bld: 67 mg/dL — ABNORMAL LOW (ref 70–99)
Potassium: 3.9 mmol/L (ref 3.5–5.1)
Sodium: 139 mmol/L (ref 135–145)

## 2019-06-03 LAB — CK
Total CK: 797 U/L — ABNORMAL HIGH (ref 49–397)
Total CK: 628 U/L — ABNORMAL HIGH (ref 49–397)

## 2019-06-03 LAB — CBC
HCT: 46.5 % (ref 39.0–52.0)
Hemoglobin: 15.7 g/dL (ref 13.0–17.0)
MCH: 32.6 pg (ref 26.0–34.0)
MCHC: 33.8 g/dL (ref 30.0–36.0)
MCV: 96.5 fL (ref 80.0–100.0)
Platelets: 283 10*3/uL (ref 150–400)
RBC: 4.82 MIL/uL (ref 4.22–5.81)
RDW: 12.4 % (ref 11.5–15.5)
WBC: 9.2 10*3/uL (ref 4.0–10.5)
nRBC: 0 % (ref 0.0–0.2)

## 2019-06-03 LAB — URINE CULTURE: Culture: NO GROWTH

## 2019-06-03 LAB — FIBRIN DERIVATIVES D-DIMER (ARMC ONLY): Fibrin derivatives D-dimer (ARMC): 303.63 ng/mL (FEU) (ref 0.00–499.00)

## 2019-06-03 LAB — GLUCOSE, CAPILLARY: Glucose-Capillary: 72 mg/dL (ref 70–99)

## 2019-06-03 MED ORDER — FLUCONAZOLE 100 MG PO TABS
300.0000 mg | ORAL_TABLET | ORAL | Status: DC
  Administered 2019-06-03: 300 mg via ORAL
  Filled 2019-06-03: qty 3

## 2019-06-03 NOTE — Progress Notes (Signed)
[facility] [place] NAME: Jeffery Mora    MR#:  [id number]  DATE OF BIRTH:  Sep 09, 1986  SUBJECTIVE:  CHIEF COMPLAINT:   Chief Complaint  Patient presents with  . Chest Pain   Patient reports that he has had rash on and off for years. It is itching. He has intermittent chest pain overnight which has improved this morning, described as pressure.  REVIEW OF SYSTEMS:  Review of Systems  Respiratory: Negative for shortness of breath.   Cardiovascular: Positive for chest pain and palpitations.  Musculoskeletal: Negative for myalgias.  Skin: Positive for itching and rash.    DRUG ALLERGIES:   Allergies  Allergen Reactions  . Bee Venom Hives   VITALS:  Blood pressure (!) 141/81, pulse 79, temperature 97.9 F (36.6 C), temperature source Oral, resp. rate 18, height 6' (1.829 m), weight 93.8 kg, SpO2 100 %. PHYSICAL EXAMINATION:  Physical Exam Vitals signs reviewed.  Constitutional:      General: He is not in acute distress.    Appearance: He is well-developed.  HENT:     Head: Normocephalic and atraumatic.  Neck:     Musculoskeletal: Normal range of motion.  Pulmonary:     Effort: Pulmonary effort is normal.  Musculoskeletal:     Right lower leg: No edema.     Left lower leg: No edema.  Skin:    General: Skin is warm.  Neurological:     General: No focal deficit present.     Mental Status: He is alert.  Psychiatric:        Mood and Affect: Mood normal.        Behavior: Behavior normal.    LABORATORY PANEL:  Male CBC Recent Labs  Lab 06/03/19 0439  WBC 9.2  HGB 15.7  HCT 46.5  PLT 283   ------------------------------------------------------------------------------------------------------------------ Chemistries  Recent Labs  Lab 06/02/19 2104 06/03/19 0439  NA 141 139  K 3.8 3.9  CL 107 107  CO2 22 20*  GLUCOSE 68* 67*  BUN 10 10  CREATININE 0.89 0.86  CALCIUM 8.5* 8.6*  AST 56*  --   ALT 50*  --    ALKPHOS 55  --   BILITOT 1.0  --    RADIOLOGY:  Dg Chest 1 View  Result Date: 06/03/2019 CLINICAL DATA:  Fever, cough. EXAM: CHEST  1 VIEW COMPARISON:  Radiographs of June 02, 2019. FINDINGS: The heart size and mediastinal contours are within normal limits. Both lungs are clear. The visualized skeletal structures are unremarkable. IMPRESSION: No active disease. Electronically Signed   By: Marijo Conception M.D.   On: 06/03/2019 08:42   Dg Chest 2 View  Result Date: 06/02/2019 CLINICAL DATA:  Positive COVID-19 screening test. Fever, cough and chest pain. EXAM: CHEST - 2 VIEW COMPARISON:  None. FINDINGS: The heart size and mediastinal contours are normal. The lungs are clear. There is no pleural effusion or pneumothorax. No acute osseous findings are identified. IMPRESSION: No active cardiopulmonary process. Electronically Signed   By: Richardean Sale M.D.   On: 06/02/2019 14:02   ASSESSMENT AND PLAN:  33 year old male patient with with polysubstance use disorder who presented from jail   1.  Sepsis of unclear origin, patient had elevated lactic acid, fever, elevated white count. CXR wnl. UA with no signs of infection. LA 4.7>3.0>1.7. - s/p doxycycline x1 along with Rocephin, Zithromax x1 in the ED.  - follow blood cultures, urine cx - covid neg -  RVP pending - continue mIVF for now  2.  Sinus tachycardia, resolved  Chest pain: patient with cocaine + UDS- hs trop 13>18>11 - continue to monitor, pain has improved - Improved with IV fluids - repeat EKG today -add-on ddimer for r/o PE  3. Elevated CK: Unclear etiology, may be 2/2 recent cocaine use - [id number]>797 - trend for resolution, am BMP to monitor renal function  4. Rash, chronic: appears fungal vs viral on exam.  - treat with diflucan 300mg  weekly for 2 doses  All the records are reviewed and case discussed with Care Management/Social Worker. Management plans discussed with the patient, family and they are in agreement.  CODE  STATUS: Full Code  POSSIBLE D/C IN 1-2 DAYS, DEPENDING ON CLINICAL CONDITION.  [place]azilandJordan Kiefer Opheim, DO PGY-3, [facility]  06/03/2019 at 12:50 PM  Between 7am to 6pm - Pager - [phone]  After 6pm go to [email or web address] - Social research officer, [email or web address] [email or web address]  [facility]  Office  [phone]  CC: Primary care physician; Patient, No Pcp Per

## 2019-06-04 LAB — HIV ANTIBODY (ROUTINE TESTING W REFLEX): HIV Screen 4th Generation wRfx: NONREACTIVE

## 2019-06-04 LAB — GLUCOSE, CAPILLARY: Glucose-Capillary: 75 mg/dL (ref 70–99)

## 2019-06-04 LAB — CBC
HCT: 45.9 % (ref 39.0–52.0)
Hemoglobin: 15.7 g/dL (ref 13.0–17.0)
MCH: 32.4 pg (ref 26.0–34.0)
MCHC: 34.2 g/dL (ref 30.0–36.0)
MCV: 94.8 fL (ref 80.0–100.0)
Platelets: 256 10*3/uL (ref 150–400)
RBC: 4.84 MIL/uL (ref 4.22–5.81)
RDW: 11.9 % (ref 11.5–15.5)
WBC: 11 10*3/uL — ABNORMAL HIGH (ref 4.0–10.5)
nRBC: 0 % (ref 0.0–0.2)

## 2019-06-04 LAB — BASIC METABOLIC PANEL
Anion gap: 14 (ref 5–15)
BUN: 6 mg/dL (ref 6–20)
CO2: 20 mmol/L — ABNORMAL LOW (ref 22–32)
Calcium: 9.1 mg/dL (ref 8.9–10.3)
Chloride: 102 mmol/L (ref 98–111)
Creatinine, Ser: 0.95 mg/dL (ref 0.61–1.24)
GFR calc Af Amer: >60 mL/min (ref >60)
GFR calc non Af Amer: >60 mL/min (ref >60)
Glucose, Bld: 86 mg/dL (ref 70–99)
Potassium: 4.4 mmol/L (ref 3.5–5.1)
Sodium: 136 mmol/L (ref 135–145)

## 2019-06-04 LAB — CK: Total CK: 381 U/L (ref 49–397)

## 2019-06-04 MED ORDER — MORPHINE SULFATE (PF) 2 MG/ML IV SOLN
2.0000 mg | Freq: Once | INTRAVENOUS | Status: AC
  Administered 2019-06-04: 2 mg via INTRAVENOUS
  Filled 2019-06-04: qty 1

## 2019-06-04 MED ORDER — FLUCONAZOLE 150 MG PO TABS: 300.0000 mg | ORAL_TABLET | ORAL | 0 refills | Status: AC

## 2019-06-04 NOTE — Discharge Instructions (Signed)
Thank you for allowing [place] to participate in your care!    You were admitted for fever and tachycardia. You improved while admitted and showed no signs of infection after receiving fluids. We are treating your rash with an antifungal and you will need to take 2, 150mg  tablets in one week on 06/10/2019 to complete the dose.   If you experience worsening of your admission symptoms, develop shortness of breath, life threatening emergency, suicidal or homicidal thoughts you must seek medical attention immediately by calling 911 or calling your MD immediately  if symptoms less severe.

## 2019-06-04 NOTE — Discharge Summary (Signed)
[facility] [place] NAME: Jeffery Mora    MR#:  [id number]  DATE OF BIRTH:  15-Oct-1986  DATE OF ADMISSION:  06/02/2019   ADMITTING PHYSICIAN: Jeffery Lesches, MD  DATE OF DISCHARGE: No discharge date for patient encounter.  PRIMARY CARE PHYSICIAN: Patient, No Pcp Per   ADMISSION DIAGNOSIS:  Sepsis, due to unspecified organism, unspecified whether acute organ dysfunction present ([place]) [A41.9] DISCHARGE DIAGNOSIS:  Active Problems:   Sepsis ([place])  SECONDARY DIAGNOSIS:  History reviewed. No pertinent past medical history. HOSPITAL COURSE:  History of presenting illness: Jeffery Mora  is a 33 y.o. male with no significant PMH presented from jail because of fever and cough for covid screening.  Patient was tachycardic with heart rate up to 160 bpm and complained of palpitations and left-sided chest pain.  Patient was COVID negative. He received IV fluids and broad spectrum abx in the ED, tachycardia improved, and patient was admitted to hospitalist service for sepsis of unknown etiology.   Hospital course: 1. Sepsis of unclear origin, patient had elevated lactic acid, fever, elevated white count. CXR wnl. UA with no signs of infection. LA 4.7>3.0>1.7. - s/p doxycycline x1 along with Rocephin, Zithromax x1 in the ED.  - did not continue antibiotics on discharge as patient has no clear sign of infection.  - Vitals improved with fluids and once cocaine was no longer in system. Patient was afebrile for 48 hours prior to discharge and hemodynamically stable. - blood cultures- NG2d; urine cx- NG2d - covid neg, RVP negative, HIV negative  2. Sinus tachycardia, resolved  Chest pain: patient with cocaine + UDS- hs trop 13>18>11 - pain and tachycardia resolved during stay - ddimer wnl for r/o of PE  3. Elevated CK: Unclear etiology, may be 2/2 recent cocaine use - [phone] with mIVF's - renal function remained wnl  4.  Rash, chronic: appears fungal on exam.  - treat with diflucan 300mg  once weekly for 2 doses. Patient to complete second dose on 06/10/2019 - Lyme disease and [place] spotted fever titers obtained in ED which have not resulted, but patient with no known tick bite and rash has been chronic for years, so less likely cause. Please follow up on these results.   DISCHARGE CONDITIONS:  stable CONSULTS OBTAINED:   DRUG ALLERGIES:   Allergies  Allergen Reactions  . Bee Venom Hives   DISCHARGE MEDICATIONS:   Allergies as of 06/04/2019      Reactions   Bee Venom Hives      Medication List    TAKE these medications   fluconazole 150 MG tablet Commonly known as: DIFLUCAN Take 2 tablets (300 mg total) by mouth once a week for 1 dose. Start taking on: June 10, 2019        DISCHARGE INSTRUCTIONS:   DIET:  Regular diet DISCHARGE CONDITION:  Stable ACTIVITY:  Activity as tolerated OXYGEN:  Home Oxygen: No.  Oxygen Delivery: n/a DISCHARGE LOCATION:  jail   If you experience worsening of your admission symptoms, develop shortness of breath, life threatening emergency, suicidal or homicidal thoughts you must seek medical attention immediately by calling 911 or calling your MD immediately  if symptoms less severe.  You Must read complete instructions/literature along with all the possible adverse reactions/side effects for all the Medicines you take and that have been prescribed to you. Take any new Medicines after you have completely understood and accpet all the possible adverse reactions/side effects.   Please  note  You were cared for by a hospitalist during your hospital stay. If you have any questions about your discharge medications or the care you received while you were in the hospital after you are discharged, you can call the unit and asked to speak with the hospitalist on call if the hospitalist that took care of you is not available. Once you are discharged, your primary  care physician will handle any further medical issues. Please note that NO REFILLS for any discharge medications will be authorized once you are discharged, as it is imperative that you return to your primary care physician (or establish a relationship with a primary care physician if you do not have one) for your aftercare needs so that they can reassess your need for medications and monitor your lab values.    On the day of Discharge:  VITAL SIGNS:  Blood pressure 134/89, pulse 60, temperature 98.1 F (36.7 C), temperature source Oral, resp. rate 19, height 6' (1.829 m), weight 95.3 kg, SpO2 100 %. PHYSICAL EXAMINATION:  General: NAD, pleasant Eyes: PERRL, EOMI, no conjunctival pallor or injection ENTM: Moist mucous membranes, no pharyngeal erythema or exudate Neck: Supple, no LAD Cardiovascular: RRR, no m/r/g, no LE edema Respiratory: CTA BL, normal work of breathing Gastrointestinal: soft, nontender, nondistended, normoactive BS MSK: moves 4 extremities equally Derm: maculopapular rash on arms and trunk Neuro: CN II-XII grossly intact Psych: AO, appropriate affect DATA REVIEW:   CBC Recent Labs  Lab 06/04/19 0617  WBC 11.0*  HGB 15.7  HCT 45.9  PLT 256    Chemistries  Recent Labs  Lab 06/02/19 2104  06/04/19 0617  NA 141   < > 136  K 3.8   < > 4.4  CL 107   < > 102  CO2 22   < > 20*  GLUCOSE 68*   < > 86  BUN 10   < > 6  CREATININE 0.89   < > 0.95  CALCIUM 8.5*   < > 9.1  AST 56*  --   --   ALT 50*  --   --   ALKPHOS 55  --   --   BILITOT 1.0  --   --    < > = values in this interval not displayed.     Microbiology Results  Results for orders placed or performed during the hospital encounter of 06/02/19  Urine culture     Status: None   Collection Time: 06/02/19  1:10 PM   Specimen: Urine, Random  Result Value Ref Range Status   Specimen Description   Final    URINE, RANDOM Performed at [facility], [address] [place], [place] [address]     Special Requests   Final    NONE Performed at [facility], [address] [place], [place] [address]    Culture   Final    NO GROWTH Performed at [facility], [address] [place], [place] [address]    Report Status 06/03/2019 FINAL  Final  SARS Coronavirus 2 (CEPHEID- Performed in [facility] hospital lab), Hosp Order     Status: None   Collection Time: 06/02/19  1:39 PM   Specimen: Nasopharyngeal Swab  Result Value Ref Range Status   SARS Coronavirus 2 NEGATIVE NEGATIVE Final    Comment: (NOTE) If result is NEGATIVE SARS-CoV-2 target nucleic acids are NOT DETECTED. The SARS-CoV-2 RNA is generally detectable in upper and lower  respiratory specimens during the acute  phase of infection. The lowest  concentration of SARS-CoV-2 viral copies this assay can detect is 250  copies / mL. A negative result does not preclude SARS-CoV-2 infection  and should not be used as the sole basis for treatment or other  patient management decisions.  A negative result may occur with  improper specimen collection / handling, submission of specimen other  than nasopharyngeal swab, presence of viral mutation(s) within the  areas targeted by this assay, and inadequate number of viral copies  (<250 copies / mL). A negative result must be combined with clinical  observations, patient history, and epidemiological information. If result is POSITIVE SARS-CoV-2 target nucleic acids are DETECTED. The SARS-CoV-2 RNA is generally detectable in upper and lower  respiratory specimens dur ing the acute phase of infection.  Positive  results are indicative of active infection with SARS-CoV-2.  Clinical  correlation with patient history and other diagnostic information is  necessary to determine patient infection status.  Positive results do  not rule out bacterial infection or co-infection with other viruses. If result is PRESUMPTIVE POSTIVE SARS-CoV-2 nucleic acids MAY BE PRESENT.   A  presumptive positive result was obtained on the submitted specimen  and confirmed on repeat testing.  While 2019 novel coronavirus  (SARS-CoV-2) nucleic acids may be present in the submitted sample  additional confirmatory testing may be necessary for epidemiological  and / or clinical management purposes  to differentiate between  SARS-CoV-2 and other Sarbecovirus currently known to infect humans.  If clinically indicated additional testing with an alternate test  methodology [phone] is advised. The SARS-CoV-2 RNA is generally  detectable in upper and lower respiratory sp ecimens during the acute  phase of infection. The expected result is Negative. Fact Sheet for Patients:  BoilerBrush.com.cyhttps://www.fda.gov/media/136312/download Fact Sheet for Healthcare Providers: https://pope.com/https://www.fda.gov/media/136313/download This test is not yet approved or cleared by the [place] FDA and has been authorized for detection and/or diagnosis of SARS-CoV-2 by FDA under an Emergency Use Authorization (EUA).  This EUA will remain in effect (meaning this test can be used) for the duration of the COVID-19 declaration under Section 564(b)(1) of the Act, 21 U.S.C. section 360bbb-3(b)(1), unless the authorization is terminated or revoked sooner. Performed at [facility], [address] [place], [place] [address]   Blood Culture (routine x 2)     Status: None (Preliminary result)   Collection Time: 06/02/19  1:39 PM   Specimen: BLOOD  Result Value Ref Range Status   Specimen Description BLOOD RIGHT ANTECUBITAL  Final   Special Requests   Final    BOTTLES DRAWN AEROBIC AND ANAEROBIC Blood Culture results may not be optimal due to an excessive volume of blood received in culture bottles   Culture   Final    NO GROWTH 2 DAYS Performed at [facility], [address] [place], [place] [address]    Report Status PENDING  Incomplete  Blood Culture (routine x 2)     Status: None (Preliminary result)    Collection Time: 06/02/19  1:44 PM   Specimen: BLOOD  Result Value Ref Range Status   Specimen Description BLOOD LEFT ANTECUBITAL  Final   Special Requests   Final    BOTTLES DRAWN AEROBIC AND ANAEROBIC Blood Culture results may not be optimal due to an excessive volume of blood received in culture bottles   Culture   Final    NO GROWTH 2 DAYS Performed at [facility], [address] [place], [place] [address]  Report Status PENDING  Incomplete  MRSA PCR Screening     Status: None   Collection Time: 06/02/19  9:15 PM   Specimen: Nasopharyngeal  Result Value Ref Range Status   MRSA by PCR NEGATIVE NEGATIVE Final    Comment:        The GeneXpert MRSA Assay (FDA approved for NASAL specimens only), is one component of a comprehensive MRSA colonization surveillance program. It is not intended to diagnose MRSA infection nor to guide or monitor treatment for MRSA infections. Performed at [facility], [address] [place], [place] [address]   Respiratory Panel by PCR     Status: None   Collection Time: 06/03/19  8:25 AM   Specimen: Nasopharyngeal Swab; Respiratory  Result Value Ref Range Status   Adenovirus NOT DETECTED NOT DETECTED Final   Coronavirus 229E NOT DETECTED NOT DETECTED Final    Comment: (NOTE) The Coronavirus on the Respiratory Panel, DOES NOT test for the novel  Coronavirus (2019 nCoV)    Coronavirus HKU1 NOT DETECTED NOT DETECTED Final   Coronavirus NL63 NOT DETECTED NOT DETECTED Final   Coronavirus OC43 NOT DETECTED NOT DETECTED Final   Metapneumovirus NOT DETECTED NOT DETECTED Final   Rhinovirus / Enterovirus NOT DETECTED NOT DETECTED Final   Influenza A NOT DETECTED NOT DETECTED Final   Influenza B NOT DETECTED NOT DETECTED Final   Parainfluenza Virus 1 NOT DETECTED NOT DETECTED Final   Parainfluenza Virus 2 NOT DETECTED NOT DETECTED Final   Parainfluenza Virus 3 NOT DETECTED NOT DETECTED Final   Parainfluenza Virus 4 NOT  DETECTED NOT DETECTED Final   Respiratory Syncytial Virus NOT DETECTED NOT DETECTED Final   Bordetella pertussis NOT DETECTED NOT DETECTED Final   Chlamydophila pneumoniae NOT DETECTED NOT DETECTED Final   Mycoplasma pneumoniae NOT DETECTED NOT DETECTED Final    Comment: Performed at [facility], [address] [place], [place] [address]    RADIOLOGY:  No results found.   Management plans discussed with the patient, family and they are in agreement.  CODE STATUS: Full Code   [place] Lameisha Schuenemann, DO PGY-3, [facility]  06/04/2019 at 10:26 AM  Between 7am to 6pm - Pager - [phone]  After 6pm go to [email or web address] - Social research officer, governmentpassword [email or web address] [email or web address]  [facility]  Office  [phone]  CC: Primary care physician; Patient, No Pcp Per   Note: This dictation was prepared with Dragon dictation along with smaller phrase technology. Any transcriptional errors that result from this process are unintentional.

## 2019-06-04 NOTE — Consult Note (Signed)
Pharmacy Antibiotic Note  Jeffery Mora is a 33 y.o. male admitted on 06/02/2019 with sepsis of unknown origin.  Pharmacy has been consulted for Vancomycin and cefepime dosing. WBC: 16.6 on admission, now 11.  Lactate: 3.0  Tmax: 100.3 on admission, now afeb.   PCT <0.10. Scr slightly trending up. Continue to monitor.   Plan: Vancomycin 2000mg  IV x 1 load dose was given 7/22. Started on Vancomycin 1750 mg IV Q 12 hrs. Goal AUC 400-550. Expected AUC: 489 SCr used: 0.95  On cefepime 2g IV every 8 hours    Height: 6' (182.9 cm) Weight: 210 lb (95.3 kg) IBW/kg (Calculated) : 77.6  Temp (24hrs), Avg:98.2 F (36.8 C), Min:98.1 F (36.7 C), Max:98.3 F (36.8 C)  Recent Labs  Lab 06/02/19 1306 06/02/19 1310 06/02/19 1510 06/02/19 2104 06/03/19 0439 06/03/19 1028 06/04/19 0617  WBC 16.6*  --   --   --  9.2  --  11.0*  CREATININE  --   --   --  0.89 0.86  --  0.95  LATICACIDVEN  --  4.7* 3.0*  --   --  1.7  --     Estimated Creatinine Clearance: 131.3 mL/min (by C-G formula based on SCr of 0.95 mg/dL).    Allergies  Allergen Reactions  . Bee Venom Hives   Antimicrobials this admission: 7/22 Doxy/CTX/Azithromycin >> x1 7/22 Vancomycin  >>  7/22 cefepime>>  Microbiology results: 7/22 BCx: pending 7/22 UCx: No growth 7/22 MRSA PCR: negative 7/22 SARS Coronavirus 2: negative   Thank you for allowing pharmacy to be a part of this patient's care.  Oswald Hillock, PharmD, BCPS Clinical Pharmacist 06/04/2019 8:13 AM

## 2019-06-05 LAB — ROCKY MTN SPOTTED FVR ABS PNL(IGG+IGM)
RMSF IgG: NEGATIVE
RMSF IgM: 0.64 index (ref 0.00–0.89)

## 2019-06-07 LAB — CULTURE, BLOOD (ROUTINE X 2)
Culture: NO GROWTH
Culture: NO GROWTH

## 2019-06-07 LAB — LYME DISEASE, WESTERN BLOT
IgG P18 Ab.: ABSENT
IgG P23 Ab.: ABSENT
IgG P28 Ab.: ABSENT
IgG P30 Ab.: ABSENT
IgG P39 Ab.: ABSENT
IgG P41 Ab.: ABSENT
IgG P45 Ab.: ABSENT
IgG P58 Ab.: ABSENT
IgG P66 Ab.: ABSENT
IgG P93 Ab.: ABSENT
IgM P39 Ab.: ABSENT
IgM P41 Ab.: ABSENT
Lyme IgG Wb: NEGATIVE
Lyme IgM Wb: NEGATIVE

## 2019-12-20 IMAGING — DX CHEST  1 VIEW
1 series · 1 of 1 positions shown · non-contrast
Comparison: Radiographs June 02, 2019.

CLINICAL DATA: Fever, cough.

EXAM:
CHEST  1 VIEW

[chest ap]
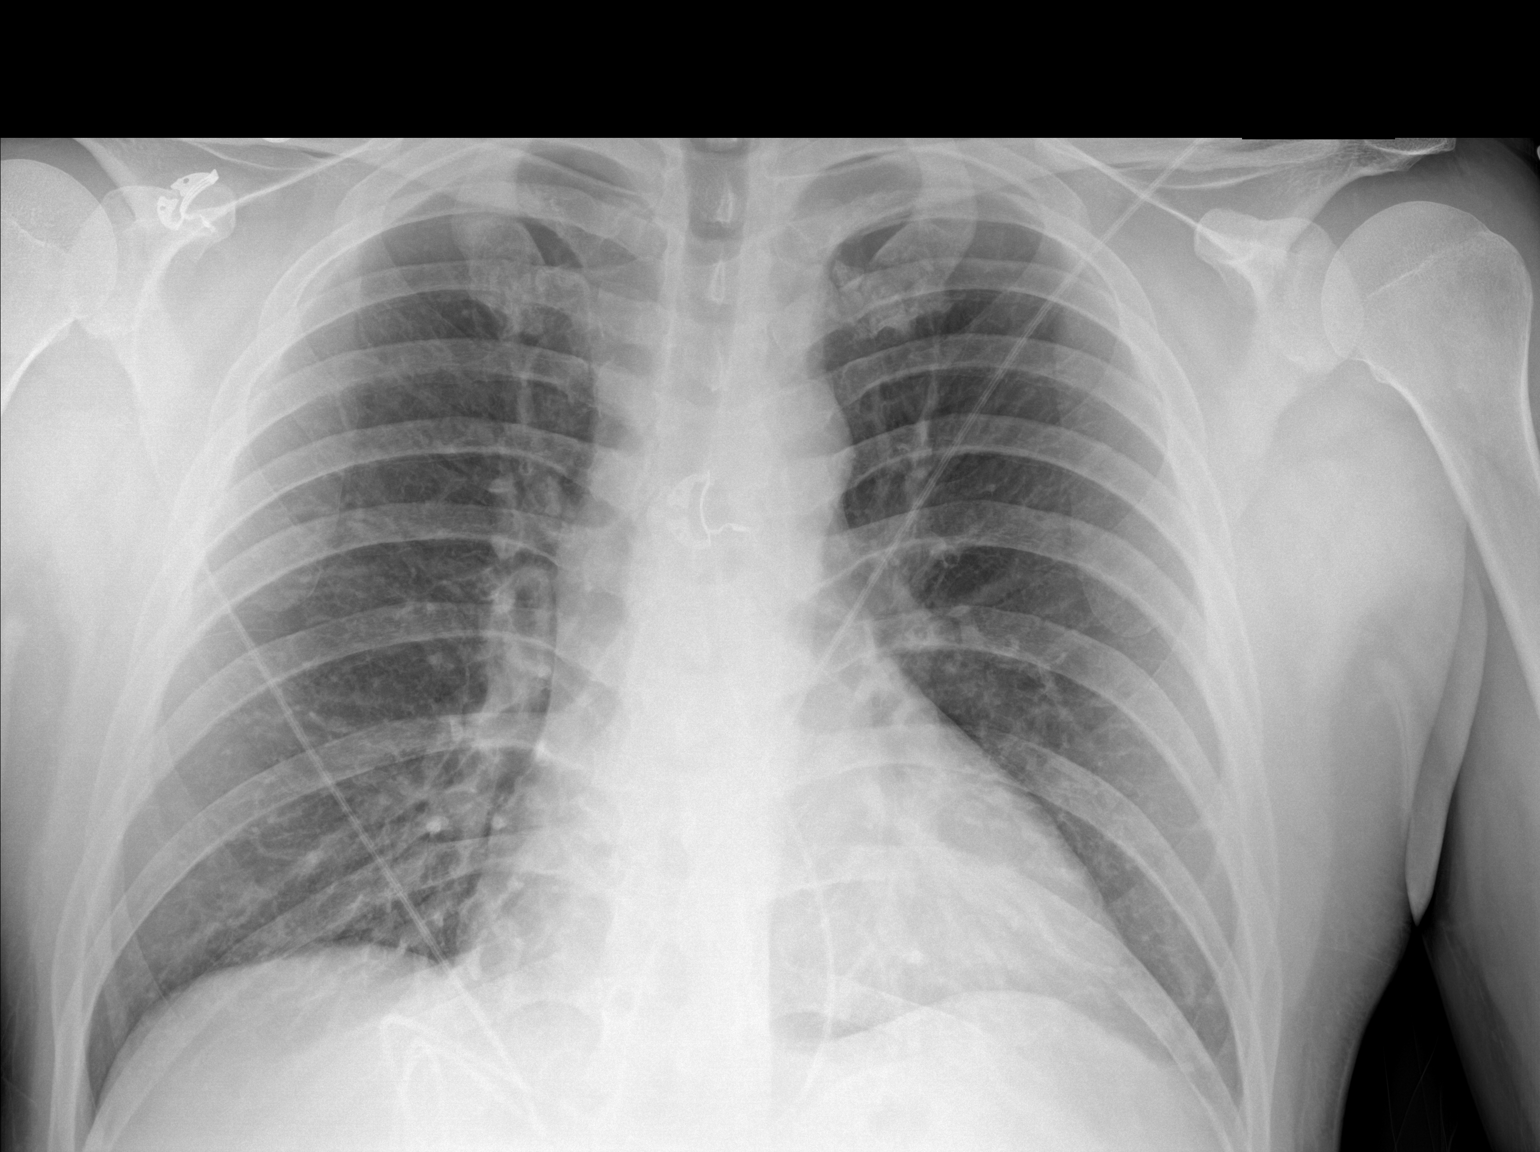

[1 of 1 positions shown; findings below may reference images not displayed]

FINDINGS: The heart size and mediastinal contours are within normal limits.
Both lungs are clear. The visualized skeletal structures are
unremarkable.
IMPRESSION: No active disease.
